# Patient Record
Sex: Male | Born: 1980 | Hispanic: No | Marital: Single | Smoking: Former smoker
Health system: Southern US, Community
[De-identification: ages and names within clinical notes are randomized; demographics above are authoritative.]

## PROBLEM LIST (undated history)

## (undated) DIAGNOSIS — B20 Human immunodeficiency virus [HIV] disease: Secondary | ICD-10-CM

## (undated) HISTORY — DX: Human immunodeficiency virus (HIV) disease: B20

---

## 2014-01-10 ENCOUNTER — Ambulatory Visit: Payer: Self-pay

## 2014-02-03 ENCOUNTER — Telehealth: Payer: Self-pay

## 2014-02-03 NOTE — Telephone Encounter (Signed)
Patient contacted regarding new intake appointment. Date and time given. Information given regarding documents needed to qualify for financial eligibility.  Tammy K King, RN  

## 2014-02-07 ENCOUNTER — Ambulatory Visit: Payer: Self-pay

## 2014-02-08 ENCOUNTER — Ambulatory Visit (INDEPENDENT_AMBULATORY_CARE_PROVIDER_SITE_OTHER): Payer: Self-pay

## 2014-02-08 DIAGNOSIS — Z23 Encounter for immunization: Secondary | ICD-10-CM

## 2014-02-08 DIAGNOSIS — Z113 Encounter for screening for infections with a predominantly sexual mode of transmission: Secondary | ICD-10-CM

## 2014-02-08 DIAGNOSIS — Z21 Asymptomatic human immunodeficiency virus [HIV] infection status: Secondary | ICD-10-CM

## 2014-02-08 DIAGNOSIS — Z79899 Other long term (current) drug therapy: Secondary | ICD-10-CM

## 2014-02-08 DIAGNOSIS — B20 Human immunodeficiency virus [HIV] disease: Secondary | ICD-10-CM

## 2014-02-08 DIAGNOSIS — F329 Major depressive disorder, single episode, unspecified: Secondary | ICD-10-CM | POA: Insufficient documentation

## 2014-02-08 DIAGNOSIS — F32A Depression, unspecified: Secondary | ICD-10-CM

## 2014-02-08 LAB — COMPLETE METABOLIC PANEL WITH GFR
ALK PHOS: 116 U/L (ref 39–117)
ALT: 46 U/L (ref 0–53)
AST: 42 U/L — AB (ref 0–37)
Albumin: 4.2 g/dL (ref 3.5–5.2)
BUN: 10 mg/dL (ref 6–23)
CO2: 23 mEq/L (ref 19–32)
CREATININE: 0.84 mg/dL (ref 0.50–1.35)
Calcium: 8.7 mg/dL (ref 8.4–10.5)
Chloride: 103 mEq/L (ref 96–112)
GFR, Est African American: >89 mL/min
GFR, Est Non African American: >89 mL/min
Glucose, Bld: 108 mg/dL — ABNORMAL HIGH (ref 70–99)
Potassium: 4.2 mEq/L (ref 3.5–5.3)
Sodium: 138 mEq/L (ref 135–145)
Total Bilirubin: 0.2 mg/dL (ref 0.2–1.2)
Total Protein: 8.3 g/dL (ref 6.0–8.3)

## 2014-02-08 LAB — CBC WITH DIFFERENTIAL/PLATELET
BASOS PCT: 0 % (ref 0–1)
Basophils Absolute: 0 10*3/uL (ref 0.0–0.1)
EOS PCT: 6 % — AB (ref 0–5)
Eosinophils Absolute: 0.3 10*3/uL (ref 0.0–0.7)
HEMATOCRIT: 38 % — AB (ref 39.0–52.0)
Hemoglobin: 13.3 g/dL (ref 13.0–17.0)
Lymphocytes Relative: 21 % (ref 12–46)
Lymphs Abs: 1.1 10*3/uL (ref 0.7–4.0)
MCH: 28.4 pg (ref 26.0–34.0)
MCHC: 35 g/dL (ref 30.0–36.0)
MCV: 81.2 fL (ref 78.0–100.0)
MONO ABS: 0.4 10*3/uL (ref 0.1–1.0)
Monocytes Relative: 7 % (ref 3–12)
NEUTROS ABS: 3.3 10*3/uL (ref 1.7–7.7)
Neutrophils Relative %: 66 % (ref 43–77)
PLATELETS: 168 10*3/uL (ref 150–400)
RBC: 4.68 MIL/uL (ref 4.22–5.81)
RDW: 13.1 % (ref 11.5–15.5)
WBC: 5 10*3/uL (ref 4.0–10.5)

## 2014-02-08 LAB — LIPID PANEL
Cholesterol: 197 mg/dL (ref 0–200)
HDL: 39 mg/dL — ABNORMAL LOW (ref >39)
LDL CALC: 130 mg/dL — AB (ref 0–99)
Total CHOL/HDL Ratio: 5.1 Ratio
Triglycerides: 142 mg/dL (ref <150)
VLDL: 28 mg/dL (ref 0–40)

## 2014-02-08 LAB — RPR

## 2014-02-08 NOTE — Progress Notes (Signed)
Patient was referred after having a positive partner testing ( oral swab) [facility].  He is currently asymptomatic. He is having problems with feeling guilt and depression since diagnosis but refused counseling . Recent weight loss due to lack of appetite.  No medical records to request. No tattoos or piercings.  Vaccines updated.

## 2014-02-09 LAB — HIV-1 RNA ULTRAQUANT REFLEX TO GENTYP+
HIV 1 RNA Quant: 73999 copies/mL — ABNORMAL HIGH (ref <20)
HIV-1 RNA QUANT, LOG: 4.87 {Log} — AB (ref <1.30)

## 2014-02-09 LAB — HEPATITIS C ANTIBODY: HCV AB: NEGATIVE

## 2014-02-09 LAB — T-HELPER CELL (CD4) - (RCID CLINIC ONLY)
CD4 T CELL ABS: 180 /uL — AB (ref 400–2700)
CD4 T CELL HELPER: 14 % — AB (ref 33–55)

## 2014-02-09 LAB — URINE CYTOLOGY ANCILLARY ONLY
CHLAMYDIA, DNA PROBE: NEGATIVE
Neisseria Gonorrhea: NEGATIVE

## 2014-02-09 LAB — HEPATITIS A ANTIBODY, TOTAL: HEP A TOTAL AB: REACTIVE — AB

## 2014-02-09 LAB — HEPATITIS B SURFACE ANTIBODY,QUALITATIVE: Hep B S Ab: POSITIVE — AB

## 2014-02-09 LAB — URINALYSIS
Bilirubin Urine: NEGATIVE
Glucose, UA: NEGATIVE mg/dL
Hgb urine dipstick: NEGATIVE
KETONES UR: NEGATIVE mg/dL
Leukocytes, UA: NEGATIVE
Nitrite: NEGATIVE
PROTEIN: NEGATIVE mg/dL
Specific Gravity, Urine: 1.025 (ref 1.005–1.030)
UROBILINOGEN UA: 0.2 mg/dL (ref 0.0–1.0)
pH: 5.5 (ref 5.0–8.0)

## 2014-02-09 LAB — HEPATITIS B CORE ANTIBODY, TOTAL: HEP B C TOTAL AB: NONREACTIVE

## 2014-02-09 LAB — HEPATITIS B SURFACE ANTIGEN: HEP B S AG: NEGATIVE

## 2014-02-11 LAB — QUANTIFERON TB GOLD ASSAY (BLOOD)
Interferon Gamma Release Assay: NEGATIVE
Mitogen value: 0.66 IU/mL
QUANTIFERON TB AG MINUS NIL: 0 [IU]/mL
Quantiferon Nil Value: 0.02 IU/mL
TB AG VALUE: 0.02 [IU]/mL

## 2014-02-13 LAB — HLA B*5701: HLA-B*5701 w/rflx HLA-B High: NEGATIVE

## 2014-02-21 LAB — HIV-1 GENOTYPR PLUS

## 2014-02-23 ENCOUNTER — Encounter: Payer: Self-pay | Admitting: Internal Medicine

## 2014-02-23 ENCOUNTER — Ambulatory Visit (INDEPENDENT_AMBULATORY_CARE_PROVIDER_SITE_OTHER): Payer: Self-pay | Admitting: Internal Medicine

## 2014-02-23 ENCOUNTER — Other Ambulatory Visit: Payer: Self-pay | Admitting: *Deleted

## 2014-02-23 VITALS — BP 110/71 | HR 60 | Temp 98.3°F | Ht 72.0 in | Wt 200.0 lb

## 2014-02-23 DIAGNOSIS — B20 Human immunodeficiency virus [HIV] disease: Secondary | ICD-10-CM

## 2014-02-23 MED ORDER — ELVITEG-COBIC-EMTRICIT-TENOFDF 150-150-200-300 MG PO TABS: 1.0000 | ORAL_TABLET | Freq: Every day | ORAL | Status: DC

## 2014-02-23 MED ORDER — SULFAMETHOXAZOLE-TMP DS 800-160 MG PO TABS: 1.0000 | ORAL_TABLET | Freq: Every day | ORAL | Status: DC

## 2014-02-23 NOTE — Assessment & Plan Note (Signed)
I discussed all the options of treatment, discussed side effects.  Will start Stribild when approved through ADAP.  Also to get Bactrim for OI prophylaxis and given prescription so he can start now.  RTC 4 weeks to see if he is tolerating.  All questions answered.

## 2014-02-23 NOTE — Progress Notes (Signed)
   Subjective:    Patient ID: Eduardo Savage, male    DOB: May 22, 1980, 33 y.o.   MRN: [id number]  HPI Here for a new patient visit.  Received partner testing and was positive.  CD4 of 180 and vl 73,999.  No questions.  Not previously tested.  No weight loss, no diarrhea.  Feels well overall. Accepting of diagnosis.  Feels that he is ready for treatment and no problem taking medicines daily.     Review of Systems  Constitutional: Negative for fever and chills.  HENT: Negative for sore throat and trouble swallowing.   Respiratory: Negative for cough and shortness of breath.   Gastrointestinal: Negative for nausea and diarrhea.  Genitourinary: Negative for discharge.  Skin: Negative for rash.  Neurological: Negative for dizziness and light-headedness.  Hematological: Negative for adenopathy.  Psychiatric/Behavioral: Negative for dysphoric mood.       Objective:   Physical Exam  Constitutional: He appears well-developed and well-nourished. No distress.  HENT:  Mouth/Throat: No oropharyngeal exudate.  Eyes: No scleral icterus.  Cardiovascular: Normal rate, regular rhythm and normal heart sounds.   No murmur heard. Pulmonary/Chest: Effort normal and breath sounds normal. No respiratory distress. He has no wheezes.  Abdominal: Soft. Bowel sounds are normal. He exhibits no distension. There is no tenderness.  Musculoskeletal: He exhibits no edema.  Lymphadenopathy:    He has no cervical adenopathy.  Skin: No rash noted.          Assessment & Plan:

## 2014-03-27 ENCOUNTER — Other Ambulatory Visit: Payer: Self-pay | Admitting: *Deleted

## 2014-03-27 DIAGNOSIS — B20 Human immunodeficiency virus [HIV] disease: Secondary | ICD-10-CM

## 2014-03-27 MED ORDER — ELVITEG-COBIC-EMTRICIT-TENOFDF 150-150-200-300 MG PO TABS: 1.0000 | ORAL_TABLET | Freq: Every day | ORAL | Status: DC

## 2014-03-27 MED ORDER — SULFAMETHOXAZOLE-TRIMETHOPRIM 800-160 MG PO TABS: 1.0000 | ORAL_TABLET | Freq: Two times a day (BID) | ORAL | Status: DC

## 2014-04-05 ENCOUNTER — Encounter: Payer: Self-pay | Admitting: Internal Medicine

## 2014-04-05 ENCOUNTER — Ambulatory Visit (INDEPENDENT_AMBULATORY_CARE_PROVIDER_SITE_OTHER): Payer: Self-pay | Admitting: Internal Medicine

## 2014-04-05 VITALS — BP 122/76 | HR 66 | Temp 98.1°F | Ht 72.0 in | Wt 201.0 lb

## 2014-04-05 DIAGNOSIS — B20 Human immunodeficiency virus [HIV] disease: Secondary | ICD-10-CM

## 2014-04-05 NOTE — Assessment & Plan Note (Signed)
Doing well, now on meds.  Emphasized compliance.  No issues identified.  Labs in 3 week and follow up in 4-5 weeks.

## 2014-04-05 NOTE — Progress Notes (Signed)
   Subjective:    Patient ID: Eduardo Savage, male    DOB: [birth year], 33 y.o.   MRN: [id number]  HPI Here for his second visit for HIV.  Received partner testing and was positive.  CD4 of 180 and vl 73,999.  Not previously tested.  No weight loss, no diarrhea.  Feels well overall. Applied for ADAP and started medicine 1 week ago.  No issues, no n/v, pleased with regimen.     Review of Systems  Constitutional: Negative for fever and chills.  HENT: Negative for sore throat and trouble swallowing.   Respiratory: Negative for cough and shortness of breath.   Gastrointestinal: Negative for nausea and diarrhea.  Genitourinary: Negative for discharge.  Skin: Negative for rash.  Neurological: Negative for dizziness and light-headedness.  Hematological: Negative for adenopathy.  Psychiatric/Behavioral: Negative for dysphoric mood.       Objective:   Physical Exam  Constitutional: He appears well-developed and well-nourished. No distress.  Eyes: No scleral icterus.  Cardiovascular: Normal rate, regular rhythm and normal heart sounds.   No murmur heard. Pulmonary/Chest: Effort normal and breath sounds normal. No respiratory distress.  Lymphadenopathy:    He has no cervical adenopathy.  Skin: No rash noted.          Assessment & Plan:

## 2014-04-26 ENCOUNTER — Other Ambulatory Visit (INDEPENDENT_AMBULATORY_CARE_PROVIDER_SITE_OTHER): Payer: Self-pay

## 2014-04-26 DIAGNOSIS — B20 Human immunodeficiency virus [HIV] disease: Secondary | ICD-10-CM

## 2014-04-27 LAB — COMPLETE METABOLIC PANEL WITH GFR
ALBUMIN: 4.1 g/dL (ref 3.5–5.2)
ALT: 21 U/L (ref 0–53)
AST: 17 U/L (ref 0–37)
Alkaline Phosphatase: 97 U/L (ref 39–117)
BILIRUBIN TOTAL: 0.3 mg/dL (ref 0.2–1.2)
BUN: 11 mg/dL (ref 6–23)
CO2: 27 mEq/L (ref 19–32)
Calcium: 8.6 mg/dL (ref 8.4–10.5)
Chloride: 101 mEq/L (ref 96–112)
Creat: 0.74 mg/dL (ref 0.50–1.35)
GFR, Est African American: >89 mL/min
GLUCOSE: 97 mg/dL (ref 70–99)
Potassium: 4 mEq/L (ref 3.5–5.3)
Sodium: 135 mEq/L (ref 135–145)
Total Protein: 7.8 g/dL (ref 6.0–8.3)

## 2014-04-27 LAB — CBC WITH DIFFERENTIAL/PLATELET
BASOS ABS: 0 10*3/uL (ref 0.0–0.1)
Basophils Relative: 0 % (ref 0–1)
EOS PCT: 5 % (ref 0–5)
Eosinophils Absolute: 0.2 10*3/uL (ref 0.0–0.7)
HCT: 39.3 % (ref 39.0–52.0)
Hemoglobin: 13.6 g/dL (ref 13.0–17.0)
LYMPHS PCT: 35 % (ref 12–46)
Lymphs Abs: 1.7 10*3/uL (ref 0.7–4.0)
MCH: 28.3 pg (ref 26.0–34.0)
MCHC: 34.6 g/dL (ref 30.0–36.0)
MCV: 81.9 fL (ref 78.0–100.0)
MONOS PCT: 6 % (ref 3–12)
MPV: 9.3 fL — ABNORMAL LOW (ref 9.4–12.4)
Monocytes Absolute: 0.3 10*3/uL (ref 0.1–1.0)
Neutro Abs: 2.6 10*3/uL (ref 1.7–7.7)
Neutrophils Relative %: 54 % (ref 43–77)
PLATELETS: 166 10*3/uL (ref 150–400)
RBC: 4.8 MIL/uL (ref 4.22–5.81)
RDW: 13.5 % (ref 11.5–15.5)
WBC: 4.9 10*3/uL (ref 4.0–10.5)

## 2014-04-27 LAB — T-HELPER CELL (CD4) - (RCID CLINIC ONLY)
CD4 T CELL HELPER: 14 % — AB (ref 33–55)
CD4 T Cell Abs: 240 /uL — ABNORMAL LOW (ref 400–2700)

## 2014-04-29 LAB — HIV-1 RNA QUANT-NO REFLEX-BLD
HIV 1 RNA Quant: 376 copies/mL — ABNORMAL HIGH (ref <20)
HIV-1 RNA Quant, Log: 2.58 {Log} — ABNORMAL HIGH (ref <1.30)

## 2014-05-23 ENCOUNTER — Ambulatory Visit: Payer: Self-pay

## 2014-05-23 ENCOUNTER — Ambulatory Visit (INDEPENDENT_AMBULATORY_CARE_PROVIDER_SITE_OTHER): Payer: Self-pay | Admitting: Internal Medicine

## 2014-05-23 ENCOUNTER — Encounter: Payer: Self-pay | Admitting: Internal Medicine

## 2014-05-23 VITALS — BP 129/80 | HR 80 | Temp 98.1°F | Wt 193.0 lb

## 2014-05-23 DIAGNOSIS — B20 Human immunodeficiency virus [HIV] disease: Secondary | ICD-10-CM

## 2014-05-23 MED ORDER — ELVITEG-COBIC-EMTRICIT-TENOFAF 150-150-200-10 MG PO TABS: 1.0000 | ORAL_TABLET | Freq: Every day | ORAL | Status: DC

## 2014-05-23 NOTE — Assessment & Plan Note (Signed)
[facility] with next refill which is today. I will also get his labs today to assure he comes down to undetectable and remains over 200. He will then return in 3 months with labs before.

## 2014-05-23 NOTE — Progress Notes (Signed)
   Subjective:    Patient ID: Eduardo Savage, male    DOB: 06-07-1980, 34 y.o.   MRN: [id number]  HPI Here for his second visit for HIV.  Received partner testing and was positive.  CD4 of 180 and vl 73,999.  Not previously tested.  No weight loss, no diarrhea.  Feels well overall. Applied for ADAP and started medicine 2 months ago.  No issues, no n/v, pleased with regimen.     Review of Systems  Constitutional: Negative for fever and chills.  HENT: Negative for sore throat and trouble swallowing.   Respiratory: Negative for cough and shortness of breath.   Gastrointestinal: Negative for nausea and diarrhea.  Genitourinary: Negative for discharge.  Skin: Negative for rash.  Neurological: Negative for dizziness and light-headedness.  Hematological: Negative for adenopathy.  Psychiatric/Behavioral: Negative for dysphoric mood.       Objective:   Physical Exam  Constitutional: He appears well-developed and well-nourished. No distress.  Eyes: No scleral icterus.  Cardiovascular: Normal rate, regular rhythm and normal heart sounds.   No murmur heard. Pulmonary/Chest: Effort normal and breath sounds normal. No respiratory distress.  Lymphadenopathy:    He has no cervical adenopathy.  Skin: No rash noted.          Assessment & Plan:

## 2014-05-24 ENCOUNTER — Other Ambulatory Visit: Payer: Self-pay | Admitting: *Deleted

## 2014-05-24 MED ORDER — ELVITEG-COBIC-EMTRICIT-TENOFAF 150-150-200-10 MG PO TABS: 1.0000 | ORAL_TABLET | Freq: Every day | ORAL | Status: DC

## 2014-08-22 ENCOUNTER — Other Ambulatory Visit (INDEPENDENT_AMBULATORY_CARE_PROVIDER_SITE_OTHER): Payer: Self-pay

## 2014-08-22 DIAGNOSIS — B20 Human immunodeficiency virus [HIV] disease: Secondary | ICD-10-CM

## 2014-08-22 LAB — COMPLETE METABOLIC PANEL WITH GFR
ALK PHOS: 101 U/L (ref 39–117)
ALT: 28 U/L (ref 0–53)
AST: 25 U/L (ref 0–37)
Albumin: 4.3 g/dL (ref 3.5–5.2)
BUN: 13 mg/dL (ref 6–23)
CO2: 23 mEq/L (ref 19–32)
Calcium: 8.6 mg/dL (ref 8.4–10.5)
Chloride: 105 mEq/L (ref 96–112)
Creat: 0.96 mg/dL (ref 0.50–1.35)
GFR, Est African American: >89 mL/min
GFR, Est Non African American: >89 mL/min
Glucose, Bld: 91 mg/dL (ref 70–99)
POTASSIUM: 3.9 meq/L (ref 3.5–5.3)
SODIUM: 138 meq/L (ref 135–145)
TOTAL PROTEIN: 7.5 g/dL (ref 6.0–8.3)
Total Bilirubin: 0.4 mg/dL (ref 0.2–1.2)

## 2014-08-23 LAB — CBC WITH DIFFERENTIAL/PLATELET
BASOS ABS: 0.1 10*3/uL (ref 0.0–0.1)
BASOS PCT: 1 % (ref 0–1)
Eosinophils Absolute: 0.5 10*3/uL (ref 0.0–0.7)
Eosinophils Relative: 8 % — ABNORMAL HIGH (ref 0–5)
HCT: 39.8 % (ref 39.0–52.0)
Hemoglobin: 13.4 g/dL (ref 13.0–17.0)
LYMPHS PCT: 26 % (ref 12–46)
Lymphs Abs: 1.6 10*3/uL (ref 0.7–4.0)
MCH: 28.7 pg (ref 26.0–34.0)
MCHC: 33.7 g/dL (ref 30.0–36.0)
MCV: 85.2 fL (ref 78.0–100.0)
MPV: 9.4 fL (ref 8.6–12.4)
Monocytes Absolute: 0.4 10*3/uL (ref 0.1–1.0)
Monocytes Relative: 6 % (ref 3–12)
NEUTROS ABS: 3.5 10*3/uL (ref 1.7–7.7)
NEUTROS PCT: 59 % (ref 43–77)
Platelets: 213 10*3/uL (ref 150–400)
RBC: 4.67 MIL/uL (ref 4.22–5.81)
RDW: 12.9 % (ref 11.5–15.5)
WBC: 6 10*3/uL (ref 4.0–10.5)

## 2014-08-23 LAB — T-HELPER CELL (CD4) - (RCID CLINIC ONLY)
CD4 T CELL HELPER: 17 % — AB (ref 33–55)
CD4 T Cell Abs: 290 /uL — ABNORMAL LOW (ref 400–2700)

## 2014-08-24 LAB — HIV-1 RNA QUANT-NO REFLEX-BLD
HIV 1 RNA Quant: <20 copies/mL (ref <20)
HIV-1 RNA Quant, Log: <1.3 {Log} (ref <1.30)

## 2014-09-05 ENCOUNTER — Ambulatory Visit (INDEPENDENT_AMBULATORY_CARE_PROVIDER_SITE_OTHER): Payer: Self-pay | Admitting: Internal Medicine

## 2014-09-05 ENCOUNTER — Encounter: Payer: Self-pay | Admitting: Internal Medicine

## 2014-09-05 VITALS — BP 116/76 | HR 58 | Temp 97.6°F | Ht 72.0 in | Wt 215.0 lb

## 2014-09-05 DIAGNOSIS — F32A Depression, unspecified: Secondary | ICD-10-CM

## 2014-09-05 DIAGNOSIS — B20 Human immunodeficiency virus [HIV] disease: Secondary | ICD-10-CM

## 2014-09-05 DIAGNOSIS — F329 Major depressive disorder, single episode, unspecified: Secondary | ICD-10-CM

## 2014-09-05 NOTE — Progress Notes (Signed)
   Subjective:    Patient ID: Eduardo Savage, male    DOB: 1980/05/28, 34 y.o.   MRN: [id number]  HPI Here for his second visit for HIV.  Received partner testing and was positive.  CD4 of 180 and vl 73,999 October 2015.  Not previously tested.  No weight loss, no diarrhea.    No issues, no n/v, pleased with Genvoya. No missed doses.  Now CD4 up to 290 and viral load undetectable.  Some depression but no SI.  Hoping for a cure.  Has family support who know his diagnosis and he can talk to. Sexually active with male partner.     Review of Systems  Constitutional: Negative for fever and chills.  HENT: Negative for sore throat and trouble swallowing.   Respiratory: Negative for cough and shortness of breath.   Gastrointestinal: Negative for nausea and diarrhea.  Genitourinary: Negative for discharge and genital sores.  Skin: Negative for rash.  Neurological: Negative for dizziness and light-headedness.  Hematological: Negative for adenopathy.  Psychiatric/Behavioral: Negative for dysphoric mood.       Objective:   Physical Exam  Constitutional: He appears well-developed and well-nourished. No distress.  Eyes: No scleral icterus.  Cardiovascular: Normal rate, regular rhythm and normal heart sounds.   No murmur heard. Pulmonary/Chest: Effort normal and breath sounds normal. No respiratory distress.  Lymphadenopathy:    He has no cervical adenopathy.  Skin: No rash noted.          Assessment & Plan:

## 2014-09-05 NOTE — Assessment & Plan Note (Signed)
States he has good support and not interested in counseling.

## 2014-09-05 NOTE — Assessment & Plan Note (Signed)
Doing great.  RTC in 3 months.

## 2014-12-05 ENCOUNTER — Other Ambulatory Visit: Payer: Self-pay | Admitting: *Deleted

## 2014-12-05 DIAGNOSIS — B20 Human immunodeficiency virus [HIV] disease: Secondary | ICD-10-CM

## 2014-12-05 MED ORDER — ELVITEG-COBIC-EMTRICIT-TENOFAF 150-150-200-10 MG PO TABS: 1.0000 | ORAL_TABLET | Freq: Every day | ORAL | Status: DC

## 2014-12-06 ENCOUNTER — Other Ambulatory Visit: Payer: Self-pay | Admitting: *Deleted

## 2014-12-06 DIAGNOSIS — B20 Human immunodeficiency virus [HIV] disease: Secondary | ICD-10-CM

## 2014-12-06 MED ORDER — ELVITEG-COBIC-EMTRICIT-TENOFAF 150-150-200-10 MG PO TABS: 1.0000 | ORAL_TABLET | Freq: Every day | ORAL | Status: DC

## 2014-12-18 ENCOUNTER — Other Ambulatory Visit: Payer: Self-pay

## 2015-01-01 ENCOUNTER — Ambulatory Visit: Payer: Self-pay | Admitting: Internal Medicine

## 2015-01-16 ENCOUNTER — Other Ambulatory Visit: Payer: Self-pay

## 2015-01-30 ENCOUNTER — Ambulatory Visit: Payer: Self-pay | Admitting: Internal Medicine

## 2015-02-13 ENCOUNTER — Ambulatory Visit: Payer: Self-pay

## 2015-03-06 ENCOUNTER — Other Ambulatory Visit: Payer: Self-pay | Admitting: *Deleted

## 2015-03-06 DIAGNOSIS — B20 Human immunodeficiency virus [HIV] disease: Secondary | ICD-10-CM

## 2015-03-06 MED ORDER — ELVITEG-COBIC-EMTRICIT-TENOFAF 150-150-200-10 MG PO TABS: 1.0000 | ORAL_TABLET | Freq: Every day | ORAL | Status: DC

## 2015-03-27 NOTE — Progress Notes (Signed)
Notified [facility] via fax. Howell, Michelle M, RN 

## 2015-04-24 ENCOUNTER — Ambulatory Visit (INDEPENDENT_AMBULATORY_CARE_PROVIDER_SITE_OTHER): Payer: Self-pay | Admitting: Internal Medicine

## 2015-04-24 ENCOUNTER — Encounter: Payer: Self-pay | Admitting: Internal Medicine

## 2015-04-24 VITALS — BP 120/71 | HR 56 | Temp 97.7°F | Wt 216.0 lb

## 2015-04-24 DIAGNOSIS — F32A Depression, unspecified: Secondary | ICD-10-CM

## 2015-04-24 DIAGNOSIS — F329 Major depressive disorder, single episode, unspecified: Secondary | ICD-10-CM

## 2015-04-24 DIAGNOSIS — Z79899 Other long term (current) drug therapy: Secondary | ICD-10-CM | POA: Insufficient documentation

## 2015-04-24 DIAGNOSIS — B20 Human immunodeficiency virus [HIV] disease: Secondary | ICD-10-CM

## 2015-04-24 DIAGNOSIS — Z23 Encounter for immunization: Secondary | ICD-10-CM

## 2015-04-24 DIAGNOSIS — Z113 Encounter for screening for infections with a predominantly sexual mode of transmission: Secondary | ICD-10-CM

## 2015-04-24 LAB — LIPID PANEL
Cholesterol: 196 mg/dL (ref 125–200)
HDL: 33 mg/dL — AB (ref >=40)
LDL Cholesterol: 123 mg/dL (ref <130)
Total CHOL/HDL Ratio: 5.9 Ratio — ABNORMAL HIGH (ref <=5.0)
Triglycerides: 202 mg/dL — ABNORMAL HIGH (ref <150)
VLDL: 40 mg/dL — ABNORMAL HIGH (ref <30)

## 2015-04-24 LAB — COMPLETE METABOLIC PANEL WITH GFR
ALT: 22 U/L (ref 9–46)
AST: 17 U/L (ref 10–40)
Albumin: 4.2 g/dL (ref 3.6–5.1)
Alkaline Phosphatase: 104 U/L (ref 40–115)
BUN: 12 mg/dL (ref 7–25)
CALCIUM: 8.9 mg/dL (ref 8.6–10.3)
CHLORIDE: 103 mmol/L (ref 98–110)
CO2: 23 mmol/L (ref 20–31)
CREATININE: 0.84 mg/dL (ref 0.60–1.35)
GFR, Est African American: >89 mL/min (ref >=60)
Glucose, Bld: 89 mg/dL (ref 65–99)
POTASSIUM: 3.8 mmol/L (ref 3.5–5.3)
Sodium: 135 mmol/L (ref 135–146)
Total Bilirubin: 0.3 mg/dL (ref 0.2–1.2)
Total Protein: 7.3 g/dL (ref 6.1–8.1)

## 2015-04-24 LAB — CBC WITH DIFFERENTIAL/PLATELET
BASOS ABS: 0.1 10*3/uL (ref 0.0–0.1)
Basophils Relative: 1 % (ref 0–1)
EOS ABS: 0.3 10*3/uL (ref 0.0–0.7)
EOS PCT: 5 % (ref 0–5)
HEMATOCRIT: 39.9 % (ref 39.0–52.0)
Hemoglobin: 13.7 g/dL (ref 13.0–17.0)
LYMPHS PCT: 32 % (ref 12–46)
Lymphs Abs: 1.8 10*3/uL (ref 0.7–4.0)
MCH: 28.2 pg (ref 26.0–34.0)
MCHC: 34.3 g/dL (ref 30.0–36.0)
MCV: 82.3 fL (ref 78.0–100.0)
MPV: 9.3 fL (ref 8.6–12.4)
Monocytes Absolute: 0.5 10*3/uL (ref 0.1–1.0)
Monocytes Relative: 9 % (ref 3–12)
Neutro Abs: 3 10*3/uL (ref 1.7–7.7)
Neutrophils Relative %: 53 % (ref 43–77)
PLATELETS: 222 10*3/uL (ref 150–400)
RBC: 4.85 MIL/uL (ref 4.22–5.81)
RDW: 13.3 % (ref 11.5–15.5)
WBC: 5.6 10*3/uL (ref 4.0–10.5)

## 2015-04-24 NOTE — Assessment & Plan Note (Signed)
Hopefully doing well.  Labs today and rtc 4 months unless concerns.

## 2015-04-24 NOTE — Progress Notes (Signed)
CC: Follow up for HIV  Interval history: Currently is asymptomatic and well-controlled on Genvoya.  Since last visit he was on Genvoya but unfortunately did not renew ADAP in time and stopped for over 1 month.  He restarted again in October and taking daily.  He feels he is getting used to it.  Denies missed doses when he has it.   Less depression  Prior to Admission medications   Medication Sig Start Date End Date Taking? Authorizing Provider  elvitegravir-cobicistat-emtricitabine-tenofovir (GENVOYA) 150-150-200-10 MG TABS tablet Take 1 tablet by mouth daily with breakfast. 03/06/15  Yes Cliffton AstersJohn Campbell, MD    Review of Systems Constitutional: negative for fatigue and malaise Gastrointestinal: negative for nausea and diarrhea All other systems reviewed and are negative   Physical Exam: CONSTITUTIONAL:in no apparent distress and alert  Filed Vitals:   04/24/15 1009  BP: 120/71  Pulse: 56  Temp: 97.7 F (36.5 C)   Eyes: anicteric HENT: no thrush, no cervical lymphadenopathy Respiratory: Normal respiratory effort; CTA B  Lab Results  Component Value Date   HIV1RNAQUANT <20 08/22/2014   HIV1RNAQUANT 376* 04/26/2014   HIV1RNAQUANT [id number]* 02/08/2014   No components found for: HIV1GENOTYPRPLUS No components found for: THELPERCELL

## 2015-04-24 NOTE — Assessment & Plan Note (Signed)
Improved as he is more accepting.

## 2015-04-24 NOTE — Addendum Note (Signed)
Addended by: Mariea ClontsGREEN, Clarabelle Oscarson D on: 04/24/2015 10:40 AM   Modules accepted: Orders

## 2015-04-25 LAB — T-HELPER CELL (CD4) - (RCID CLINIC ONLY)
CD4 % Helper T Cell: 17 % — ABNORMAL LOW (ref 33–55)
CD4 T CELL ABS: 310 /uL — AB (ref 400–2700)

## 2015-04-25 LAB — RPR

## 2015-04-26 LAB — HIV-1 RNA QUANT-NO REFLEX-BLD
HIV 1 RNA Quant: <20 copies/mL (ref <20)
HIV-1 RNA Quant, Log: <1.3 Log copies/mL (ref <1.30)

## 2015-05-21 ENCOUNTER — Ambulatory Visit: Payer: Self-pay

## 2015-10-20 ENCOUNTER — Other Ambulatory Visit: Payer: Self-pay | Admitting: Internal Medicine

## 2015-10-20 DIAGNOSIS — B20 Human immunodeficiency virus [HIV] disease: Secondary | ICD-10-CM

## 2015-11-27 ENCOUNTER — Ambulatory Visit: Payer: Self-pay

## 2015-12-05 ENCOUNTER — Encounter: Payer: Self-pay | Admitting: Internal Medicine

## 2015-12-05 ENCOUNTER — Ambulatory Visit: Payer: Self-pay

## 2015-12-29 ENCOUNTER — Other Ambulatory Visit: Payer: Self-pay | Admitting: Internal Medicine

## 2015-12-29 DIAGNOSIS — B20 Human immunodeficiency virus [HIV] disease: Secondary | ICD-10-CM

## 2016-03-08 ENCOUNTER — Other Ambulatory Visit: Payer: Self-pay | Admitting: Internal Medicine

## 2016-03-08 DIAGNOSIS — B20 Human immunodeficiency virus [HIV] disease: Secondary | ICD-10-CM

## 2016-03-10 ENCOUNTER — Telehealth: Payer: Self-pay | Admitting: *Deleted

## 2016-03-10 NOTE — Telephone Encounter (Signed)
Pharmacy requesting refill, patient due for visit. RN contacted patient, gave appointments, 1 refill of medication. Patient understands he must complete his appointments for future refills. Andree CossHowell, Nahal Wanless M, RN

## 2016-03-17 ENCOUNTER — Other Ambulatory Visit: Payer: Self-pay

## 2016-03-20 ENCOUNTER — Other Ambulatory Visit (INDEPENDENT_AMBULATORY_CARE_PROVIDER_SITE_OTHER): Payer: Self-pay

## 2016-03-20 DIAGNOSIS — Z113 Encounter for screening for infections with a predominantly sexual mode of transmission: Secondary | ICD-10-CM

## 2016-03-20 DIAGNOSIS — B20 Human immunodeficiency virus [HIV] disease: Secondary | ICD-10-CM

## 2016-03-20 DIAGNOSIS — Z79899 Other long term (current) drug therapy: Secondary | ICD-10-CM

## 2016-03-20 LAB — COMPREHENSIVE METABOLIC PANEL
ALBUMIN: 4.3 g/dL (ref 3.6–5.1)
ALT: 21 U/L (ref 9–46)
AST: 15 U/L (ref 10–40)
Alkaline Phosphatase: 118 U/L — ABNORMAL HIGH (ref 40–115)
BUN: 11 mg/dL (ref 7–25)
CALCIUM: 8.8 mg/dL (ref 8.6–10.3)
CHLORIDE: 103 mmol/L (ref 98–110)
CO2: 23 mmol/L (ref 20–31)
Creat: 0.98 mg/dL (ref 0.60–1.35)
GLUCOSE: 135 mg/dL — AB (ref 65–99)
Potassium: 3.8 mmol/L (ref 3.5–5.3)
SODIUM: 136 mmol/L (ref 135–146)
Total Bilirubin: 0.3 mg/dL (ref 0.2–1.2)
Total Protein: 7.4 g/dL (ref 6.1–8.1)

## 2016-03-20 LAB — LIPID PANEL
CHOL/HDL RATIO: 5 ratio — AB (ref <5.0)
CHOLESTEROL: 205 mg/dL — AB (ref <200)
HDL: 41 mg/dL (ref >40)
LDL Cholesterol: 131 mg/dL — ABNORMAL HIGH (ref <100)
Triglycerides: 167 mg/dL — ABNORMAL HIGH (ref <150)
VLDL: 33 mg/dL — ABNORMAL HIGH (ref <30)

## 2016-03-20 LAB — CBC
HEMATOCRIT: 41.2 % (ref 38.5–50.0)
HEMOGLOBIN: 14.1 g/dL (ref 13.2–17.1)
MCH: 29 pg (ref 27.0–33.0)
MCHC: 34.2 g/dL (ref 32.0–36.0)
MCV: 84.8 fL (ref 80.0–100.0)
MPV: 9.6 fL (ref 7.5–12.5)
Platelets: 246 10*3/uL (ref 140–400)
RBC: 4.86 MIL/uL (ref 4.20–5.80)
RDW: 12.9 % (ref 11.0–15.0)
WBC: 7.8 10*3/uL (ref 3.8–10.8)

## 2016-03-21 LAB — T-HELPER CELL (CD4) - (RCID CLINIC ONLY)
CD4 T CELL HELPER: 21 % — AB (ref 33–55)
CD4 T Cell Abs: 530 /uL (ref 400–2700)

## 2016-03-21 LAB — RPR

## 2016-03-24 LAB — HIV-1 RNA QUANT-NO REFLEX-BLD
HIV 1 RNA Quant: 33 copies/mL — ABNORMAL HIGH (ref <20)
HIV-1 RNA QUANT, LOG: 1.52 {Log_copies}/mL — AB (ref <1.30)

## 2016-03-31 ENCOUNTER — Ambulatory Visit: Payer: Self-pay | Admitting: Internal Medicine

## 2016-04-19 ENCOUNTER — Other Ambulatory Visit: Payer: Self-pay | Admitting: Internal Medicine

## 2016-04-19 DIAGNOSIS — B20 Human immunodeficiency virus [HIV] disease: Secondary | ICD-10-CM

## 2016-09-02 ENCOUNTER — Ambulatory Visit: Payer: Self-pay

## 2016-09-04 ENCOUNTER — Other Ambulatory Visit: Payer: Self-pay | Admitting: *Deleted

## 2016-09-04 DIAGNOSIS — B20 Human immunodeficiency virus [HIV] disease: Secondary | ICD-10-CM

## 2016-09-04 MED ORDER — ELVITEG-COBIC-EMTRICIT-TENOFAF 150-150-200-10 MG PO TABS: 1.0000 | ORAL_TABLET | Freq: Every day | ORAL | 2 refills | Status: DC

## 2016-09-05 ENCOUNTER — Encounter: Payer: Self-pay | Admitting: Internal Medicine

## 2016-09-10 ENCOUNTER — Other Ambulatory Visit: Payer: Self-pay

## 2016-09-10 ENCOUNTER — Other Ambulatory Visit (INDEPENDENT_AMBULATORY_CARE_PROVIDER_SITE_OTHER): Payer: Self-pay

## 2016-09-10 DIAGNOSIS — B20 Human immunodeficiency virus [HIV] disease: Secondary | ICD-10-CM

## 2016-09-10 DIAGNOSIS — Z113 Encounter for screening for infections with a predominantly sexual mode of transmission: Secondary | ICD-10-CM

## 2016-09-10 LAB — CBC WITH DIFFERENTIAL/PLATELET
Basophils Absolute: 80 cells/uL (ref 0–200)
Basophils Relative: 1 %
EOS ABS: 480 {cells}/uL (ref 15–500)
Eosinophils Relative: 6 %
HEMATOCRIT: 42.5 % (ref 38.5–50.0)
HEMOGLOBIN: 13.8 g/dL (ref 13.2–17.1)
LYMPHS ABS: 2480 {cells}/uL (ref 850–3900)
Lymphocytes Relative: 31 %
MCH: 27.4 pg (ref 27.0–33.0)
MCHC: 32.5 g/dL (ref 32.0–36.0)
MCV: 84.5 fL (ref 80.0–100.0)
MPV: 9.3 fL (ref 7.5–12.5)
Monocytes Absolute: 560 cells/uL (ref 200–950)
Monocytes Relative: 7 %
Neutro Abs: 4400 cells/uL (ref 1500–7800)
Neutrophils Relative %: 55 %
Platelets: 222 10*3/uL (ref 140–400)
RBC: 5.03 MIL/uL (ref 4.20–5.80)
RDW: 13.8 % (ref 11.0–15.0)
WBC: 8 10*3/uL (ref 3.8–10.8)

## 2016-09-10 LAB — COMPLETE METABOLIC PANEL WITH GFR
ALBUMIN: 4.1 g/dL (ref 3.6–5.1)
ALT: 26 U/L (ref 9–46)
AST: 20 U/L (ref 10–40)
Alkaline Phosphatase: 108 U/L (ref 40–115)
BILIRUBIN TOTAL: 0.3 mg/dL (ref 0.2–1.2)
BUN: 12 mg/dL (ref 7–25)
CALCIUM: 8.4 mg/dL — AB (ref 8.6–10.3)
CO2: 24 mmol/L (ref 20–31)
CREATININE: 0.89 mg/dL (ref 0.60–1.35)
Chloride: 106 mmol/L (ref 98–110)
GFR, Est Non African American: >89 mL/min (ref >=60)
Glucose, Bld: 102 mg/dL — ABNORMAL HIGH (ref 65–99)
Potassium: 3.6 mmol/L (ref 3.5–5.3)
Sodium: 140 mmol/L (ref 135–146)
Total Protein: 6.9 g/dL (ref 6.1–8.1)

## 2016-09-10 MED ORDER — ELVITEG-COBIC-EMTRICIT-TENOFAF 150-150-200-10 MG PO TABS: 1.0000 | ORAL_TABLET | Freq: Every day | ORAL | 2 refills | Status: DC

## 2016-09-11 LAB — URINE CYTOLOGY ANCILLARY ONLY
Chlamydia: NEGATIVE
Neisseria Gonorrhea: NEGATIVE

## 2016-09-11 LAB — RPR

## 2016-09-11 LAB — T-HELPER CELL (CD4) - (RCID CLINIC ONLY)
CD4 T CELL ABS: 560 /uL (ref 400–2700)
CD4 T CELL HELPER: 23 % — AB (ref 33–55)

## 2016-09-13 LAB — HIV-1 RNA,QN PCR W/REFLEX GENOTYPE: HIV-1 RNA, QN PCR: <20 Copies/mL — ABNORMAL HIGH

## 2016-09-23 ENCOUNTER — Encounter: Payer: Self-pay | Admitting: Internal Medicine

## 2016-09-23 ENCOUNTER — Ambulatory Visit (INDEPENDENT_AMBULATORY_CARE_PROVIDER_SITE_OTHER): Payer: Self-pay | Admitting: Internal Medicine

## 2016-09-23 VITALS — BP 121/80 | HR 65 | Temp 97.9°F | Ht 72.0 in | Wt 216.0 lb

## 2016-09-23 DIAGNOSIS — B20 Human immunodeficiency virus [HIV] disease: Secondary | ICD-10-CM

## 2016-09-23 DIAGNOSIS — Z23 Encounter for immunization: Secondary | ICD-10-CM

## 2016-09-23 DIAGNOSIS — Z113 Encounter for screening for infections with a predominantly sexual mode of transmission: Secondary | ICD-10-CM

## 2016-09-23 NOTE — Addendum Note (Signed)
Addended by: Linnell FullingBRANNON, Tayte Childers N on: 09/23/2016 03:28 PM   Modules accepted: Orders

## 2016-09-23 NOTE — Progress Notes (Signed)
   Subjective:    Patient ID: Eduardo Savage, male    DOB: 1980/11/12, 36 y.o.   MRN: [id number]  HPI Here for follow up of HIV Has not been seen by me in about 2 years.  Has though had labs and CD4 has remained good and is 560 with a suppressed viral load.  No new issues.  Takes daily with no missed doses.  No sexual activity.     Review of Systems  Constitutional: Negative for unexpected weight change.  Gastrointestinal: Negative for diarrhea.  Skin: Negative for rash.  Neurological: Negative for dizziness.       Objective:   Physical Exam  Constitutional: He appears well-developed and well-nourished. No distress.  HENT:  Mouth/Throat: No oropharyngeal exudate.  Eyes: No scleral icterus.  Cardiovascular: Normal rate, regular rhythm and normal heart sounds.   No murmur heard. Pulmonary/Chest: Effort normal and breath sounds normal. No respiratory distress.  Lymphadenopathy:    He has no cervical adenopathy.  Skin: No rash noted.          Assessment & Plan:

## 2016-09-23 NOTE — Assessment & Plan Note (Signed)
Screened negative 

## 2016-09-23 NOTE — Assessment & Plan Note (Signed)
Doing well at this point and will continue with every 6 month follow up

## 2016-12-29 ENCOUNTER — Encounter: Payer: Self-pay | Admitting: Internal Medicine

## 2017-03-31 ENCOUNTER — Encounter: Payer: Self-pay | Admitting: Internal Medicine

## 2017-03-31 ENCOUNTER — Ambulatory Visit (INDEPENDENT_AMBULATORY_CARE_PROVIDER_SITE_OTHER): Payer: Self-pay | Admitting: Internal Medicine

## 2017-03-31 VITALS — BP 124/77 | HR 58 | Temp 97.9°F | Wt 219.0 lb

## 2017-03-31 DIAGNOSIS — Z79899 Other long term (current) drug therapy: Secondary | ICD-10-CM

## 2017-03-31 DIAGNOSIS — Z23 Encounter for immunization: Secondary | ICD-10-CM

## 2017-03-31 DIAGNOSIS — B20 Human immunodeficiency virus [HIV] disease: Secondary | ICD-10-CM

## 2017-03-31 LAB — LIPID PANEL
CHOL/HDL RATIO: 6.2 (calc) — AB (ref <5.0)
Cholesterol: 223 mg/dL — ABNORMAL HIGH (ref <200)
HDL: 36 mg/dL — ABNORMAL LOW (ref >40)
LDL Cholesterol (Calc): 153 mg/dL (calc) — ABNORMAL HIGH
NON-HDL CHOLESTEROL (CALC): 187 mg/dL — AB (ref <130)
TRIGLYCERIDES: 198 mg/dL — AB (ref <150)

## 2017-03-31 NOTE — Progress Notes (Signed)
   Subjective:    Patient ID: Eduardo Savage, male    DOB: [birth year], 36 y.o.   MRN: [id number]  HPI Here for follow up of HIV Continues on Genvoya and no issues.  No missed doses.  No associated n/v/d.  No complaints.     Review of Systems  Gastrointestinal: Negative for nausea.  Skin: Negative for rash.       Objective:   Physical Exam  Constitutional: He appears well-developed and well-nourished. No distress.  Eyes: No scleral icterus.  Cardiovascular: Normal rate, regular rhythm and normal heart sounds.  No murmur heard. Pulmonary/Chest: Effort normal and breath sounds normal. No respiratory distress.  Lymphadenopathy:    He has no cervical adenopathy.  Skin: No rash noted.          Assessment & Plan:

## 2017-03-31 NOTE — Assessment & Plan Note (Signed)
Labs today and rtc 6 months unless concerns.  

## 2017-03-31 NOTE — Assessment & Plan Note (Signed)
Lipid panel today

## 2017-04-01 LAB — T-HELPER CELL (CD4) - (RCID CLINIC ONLY)
CD4 T CELL HELPER: 21 % — AB (ref 33–55)
CD4 T Cell Abs: 470 /uL (ref 400–2700)

## 2017-04-02 LAB — HIV-1 RNA QUANT-NO REFLEX-BLD
HIV 1 RNA QUANT: NOT DETECTED {copies}/mL
HIV-1 RNA Quant, Log: <1.3 Log copies/mL

## 2017-04-18 ENCOUNTER — Other Ambulatory Visit: Payer: Self-pay | Admitting: Internal Medicine

## 2017-04-18 DIAGNOSIS — B20 Human immunodeficiency virus [HIV] disease: Secondary | ICD-10-CM

## 2017-06-25 ENCOUNTER — Encounter: Payer: Self-pay | Admitting: Internal Medicine

## 2017-09-29 ENCOUNTER — Ambulatory Visit: Payer: Self-pay | Admitting: Internal Medicine

## 2017-10-01 ENCOUNTER — Other Ambulatory Visit: Payer: Self-pay | Admitting: Internal Medicine

## 2017-10-01 DIAGNOSIS — B20 Human immunodeficiency virus [HIV] disease: Secondary | ICD-10-CM

## 2017-10-05 ENCOUNTER — Other Ambulatory Visit: Payer: Self-pay | Admitting: *Deleted

## 2017-10-05 DIAGNOSIS — Z113 Encounter for screening for infections with a predominantly sexual mode of transmission: Secondary | ICD-10-CM

## 2017-10-05 DIAGNOSIS — B20 Human immunodeficiency virus [HIV] disease: Secondary | ICD-10-CM

## 2017-10-07 ENCOUNTER — Other Ambulatory Visit: Payer: Self-pay

## 2017-10-08 ENCOUNTER — Other Ambulatory Visit: Payer: Self-pay

## 2017-10-13 ENCOUNTER — Other Ambulatory Visit: Payer: Self-pay

## 2017-10-13 ENCOUNTER — Emergency Department (HOSPITAL_COMMUNITY)
Admission: EM | Admit: 2017-10-13 | Discharge: 2017-10-14 | Disposition: A | Discharge status: Home / Self Care | Payer: Self-pay | Attending: Emergency Medicine | Admitting: Emergency Medicine

## 2017-10-13 DIAGNOSIS — R05 Cough: Secondary | ICD-10-CM | POA: Insufficient documentation

## 2017-10-13 DIAGNOSIS — R059 Cough, unspecified: Secondary | ICD-10-CM

## 2017-10-13 DIAGNOSIS — Z79899 Other long term (current) drug therapy: Secondary | ICD-10-CM | POA: Insufficient documentation

## 2017-10-13 DIAGNOSIS — Z87891 Personal history of nicotine dependence: Secondary | ICD-10-CM | POA: Insufficient documentation

## 2017-10-14 ENCOUNTER — Emergency Department (HOSPITAL_COMMUNITY): Payer: Self-pay

## 2017-10-14 ENCOUNTER — Encounter (HOSPITAL_COMMUNITY): Payer: Self-pay | Admitting: Emergency Medicine

## 2017-10-14 LAB — COMPREHENSIVE METABOLIC PANEL
ALBUMIN: 3.7 g/dL (ref 3.5–5.0)
ALK PHOS: 98 U/L (ref 38–126)
ALT: 22 U/L (ref 17–63)
ANION GAP: 8 (ref 5–15)
AST: 21 U/L (ref 15–41)
BUN: 11 mg/dL (ref 6–20)
CO2: 24 mmol/L (ref 22–32)
Calcium: 8.4 mg/dL — ABNORMAL LOW (ref 8.9–10.3)
Chloride: 107 mmol/L (ref 101–111)
Creatinine, Ser: 0.82 mg/dL (ref 0.61–1.24)
GFR calc Af Amer: >60 mL/min (ref >60)
GFR calc non Af Amer: >60 mL/min (ref >60)
GLUCOSE: 137 mg/dL — AB (ref 65–99)
POTASSIUM: 3.2 mmol/L — AB (ref 3.5–5.1)
SODIUM: 139 mmol/L (ref 135–145)
Total Bilirubin: 0.4 mg/dL (ref 0.3–1.2)
Total Protein: 7 g/dL (ref 6.5–8.1)

## 2017-10-14 LAB — CBC WITH DIFFERENTIAL/PLATELET
ABS IMMATURE GRANULOCYTES: 0.1 10*3/uL (ref 0.0–0.1)
Basophils Absolute: 0.1 10*3/uL (ref 0.0–0.1)
Basophils Relative: 1 %
Eosinophils Absolute: 0.4 10*3/uL (ref 0.0–0.7)
Eosinophils Relative: 3 %
HEMATOCRIT: 43.3 % (ref 39.0–52.0)
HEMOGLOBIN: 14.3 g/dL (ref 13.0–17.0)
Immature Granulocytes: 1 %
LYMPHS PCT: 27 %
Lymphs Abs: 3.3 10*3/uL (ref 0.7–4.0)
MCH: 27.2 pg (ref 26.0–34.0)
MCHC: 33 g/dL (ref 30.0–36.0)
MCV: 82.5 fL (ref 78.0–100.0)
MONO ABS: 0.7 10*3/uL (ref 0.1–1.0)
MONOS PCT: 6 %
NEUTROS ABS: 7.9 10*3/uL — AB (ref 1.7–7.7)
Neutrophils Relative %: 64 %
Platelets: 288 10*3/uL (ref 150–400)
RBC: 5.25 MIL/uL (ref 4.22–5.81)
RDW: 12 % (ref 11.5–15.5)
WBC: 12.5 10*3/uL — ABNORMAL HIGH (ref 4.0–10.5)

## 2017-10-14 LAB — GROUP A STREP BY PCR: GROUP A STREP BY PCR: NOT DETECTED

## 2017-10-14 MED ORDER — POTASSIUM CHLORIDE CRYS ER 20 MEQ PO TBCR
40.0000 meq | EXTENDED_RELEASE_TABLET | Freq: Once | ORAL | Status: AC
  Administered 2017-10-14: 40 meq via ORAL
  Filled 2017-10-14: qty 2

## 2017-10-14 MED ORDER — BENZONATATE 100 MG PO CAPS: 100.0000 mg | ORAL_CAPSULE | Freq: Three times a day (TID) | ORAL | 0 refills | Status: DC | PRN

## 2017-10-14 MED ORDER — AZITHROMYCIN 250 MG PO TABS: 250.0000 mg | ORAL_TABLET | Freq: Every day | ORAL | 0 refills | Status: DC

## 2017-10-14 MED ORDER — HYDROCOD POLST-CPM POLST ER 10-8 MG/5ML PO SUER: 5.0000 mL | Freq: Two times a day (BID) | ORAL | 0 refills | Status: DC | PRN

## 2017-10-14 NOTE — ED Provider Notes (Addendum)
[facility] Provider Note   CSN: [id number] Arrival date & time: 10/13/17  2356     History   Chief Complaint Chief Complaint  Patient presents with  . Cough    HPI Eduardo Savage is a 37 y.o. male.  HPI  37 year old male with a history of HIV (most recent CD4 470) presents with cough. Cough has been present for 3 weeks. A couple times he's coughed so hard he has briefly passed out (~5-10 seconds). Most recent time 2 days ago. Suffered small abrasions to left forehead. No headache, vomiting or weakness. Some chest pain with coughing. Felt short of breath at first, that has resolved. No hemoptysis. No productive cough or fevers. Has tried mucinex with no relief. Feels like he's losing his voice.  Past Medical History:  Diagnosis Date  . HIV infection [facility](HCC)     Patient Active Problem List   Diagnosis Date Noted  . Screening examination for venereal disease 04/24/2015  . Encounter for long-term (current) use of medications 04/24/2015  . Human immunodeficiency virus (HIV) disease (HCC) 02/23/2014  . Depression 02/08/2014    History reviewed. No pertinent surgical history.      Home Medications    Prior to Admission medications   Medication Sig Start Date End Date Taking? Authorizing Provider  GENVOYA 150-150-200-10 MG TABS tablet TAKE 1 TABLET BY MOUTH DAILY WITH BREAKFAST 10/01/17   Comer, Belia Hemanobert W, MD    Family History Family History  Problem Relation Age of Onset  . Diabetes Father     Social History Social History   Tobacco Use  . Smoking status: Former Smoker    Years: 4.00    Last attempt to quit: 01/24/2014    Years since quitting: 3.7  . Smokeless tobacco: Former NeurosurgeonUser    Types: Snuff, Chew  . Tobacco comment: congratulated!  Substance Use Topics  . Alcohol use: Yes    Alcohol/week: 2.4 oz    Types: 4 Cans of beer per week  . Drug use: No     Allergies   Patient has no known allergies.   Review of  Systems Review of Systems  Constitutional: Negative for fever.  HENT: Positive for voice change. Negative for congestion.   Respiratory: Positive for cough. Negative for shortness of breath.   Cardiovascular: Positive for chest pain (with cough).  Gastrointestinal: Negative for abdominal pain and vomiting.  Neurological: Positive for syncope (after prolonged coughing). Negative for headaches.  All other systems reviewed and are negative.    Physical Exam Updated Vital Signs BP 109/73 (BP Location: Right Arm)   Pulse (!) 55   Temp 97.8 F (36.6 C) (Oral)   Resp 18   Ht 6' (1.829 m)   Wt 91.2 kg (201 lb)   SpO2 100%   BMI 27.26 kg/m   Physical Exam  Constitutional: He is oriented to person, place, and time. He appears well-developed and well-nourished. No distress.  HENT:  Head: Normocephalic.    Right Ear: External ear normal.  Left Ear: External ear normal.  Nose: Nose normal.  Mouth/Throat: No oropharyngeal exudate.  Eyes: Right eye exhibits no discharge. Left eye exhibits no discharge.  Neck: Neck supple.  Cardiovascular: Normal rate, regular rhythm and normal heart sounds.  No murmur heard. Pulmonary/Chest: Effort normal and breath sounds normal. He has no wheezes. He has no rales.  Abdominal: Soft. He exhibits no distension. There is no tenderness.  Musculoskeletal: He exhibits no edema.  Neurological: He  is alert and oriented to person, place, and time.  Skin: Skin is warm and dry. He is not diaphoretic.  Nursing note and vitals reviewed.    ED Treatments / Results  Labs (all labs ordered are listed, but only abnormal results are displayed) Labs Reviewed  COMPREHENSIVE METABOLIC PANEL - Abnormal; Notable for the following components:      Result Value   Potassium 3.2 (*)    Glucose, Bld 137 (*)    Calcium 8.4 (*)    All other components within normal limits  CBC WITH DIFFERENTIAL/PLATELET - Abnormal; Notable for the following components:   WBC 12.5 (*)     Neutro Abs 7.9 (*)    All other components within normal limits  GROUP A STREP BY PCR    EKG None ED ECG REPORT   Date: 10/14/2017  Rate: 59  Rhythm: sinus bradycardia  QRS Axis: normal  Intervals: normal  ST/T Wave abnormalities: nonspecific T wave changes  Conduction Disutrbances:none  Narrative Interpretation:   Old EKG Reviewed: none available  I have personally reviewed the EKG tracing and agree with the computerized printout as noted.   Radiology Dg Chest 2 View  Result Date: 10/14/2017 CLINICAL DATA:  Chest pain and cough EXAM: CHEST - 2 VIEW COMPARISON:  None. FINDINGS: There is shallow lung inflation. The cardiomediastinal contours are normal. Mild peribronchial opacity without consolidation or edema. There is no pleural effusion or pneumothorax. IMPRESSION: Mild peribronchial coarsening may indicate chronic bronchitis. No focal airspace consolidation. Electronically Signed   By: Deatra Robinson M.D.   On: 10/14/2017 01:49    Procedures Procedures (including critical care time)  Medications Ordered in ED Medications  potassium chloride SA (K-DUR,KLOR-CON) CR tablet 40 mEq (has no administration in time range)     Initial Impression / Assessment and Plan / ED Course  I have reviewed the triage vital signs and the nursing notes.  Pertinent labs & imaging results that were available during my care of the patient were reviewed by me and considered in my medical decision making (see chart for details).     Patient has no obvious bacterial pneumonia. He is not hypoxic or having increased work of breathing. No wheezes. Appears well. The syncope is almost undoubtedly from prolonged hard coughing. ECG without concerning findings. I doubt PCP pneumonia in this setting. He is afebrile, and labs show mild leukocytosis and mild hypokalemia. K repleted. He does not appear significantly ill. Will treat cough, and cover with azithro for atypical pathogens, though this could be  viral as well. However given lower concern for PCP I don't think Bactrim needed at this time. Needs to f/u with ID. Return precautions.  Final Clinical Impressions(s) / ED Diagnoses   Final diagnoses:  Cough    ED Discharge Orders    None       Pricilla Loveless, MD 10/14/17 1610    Pricilla Loveless, MD 10/27/17 2101

## 2017-10-14 NOTE — ED Triage Notes (Signed)
Pt reports a cough for 3X weeks. Pt states he coughs so hard that he feels like he is going to pass out.  Pt stable in triage.

## 2017-10-14 NOTE — Discharge Instructions (Addendum)
If you develop worsening cough, shortness of breath, coughing up blood, fevers, or other new/concerning symptoms, then return to the ER for evaluation. Otherwise follow up with your doctor as listed.

## 2017-10-21 ENCOUNTER — Ambulatory Visit: Payer: Self-pay | Admitting: Internal Medicine

## 2017-10-27 ENCOUNTER — Encounter: Payer: Self-pay | Admitting: Internal Medicine

## 2017-10-27 ENCOUNTER — Other Ambulatory Visit: Payer: Self-pay

## 2017-10-27 ENCOUNTER — Ambulatory Visit (INDEPENDENT_AMBULATORY_CARE_PROVIDER_SITE_OTHER): Payer: Self-pay | Admitting: Internal Medicine

## 2017-10-27 VITALS — BP 110/69 | HR 54 | Temp 97.9°F | Ht 72.0 in | Wt 213.0 lb

## 2017-10-27 DIAGNOSIS — B20 Human immunodeficiency virus [HIV] disease: Secondary | ICD-10-CM

## 2017-10-27 DIAGNOSIS — Z23 Encounter for immunization: Secondary | ICD-10-CM

## 2017-10-27 DIAGNOSIS — Z7189 Other specified counseling: Secondary | ICD-10-CM

## 2017-10-27 DIAGNOSIS — Z113 Encounter for screening for infections with a predominantly sexual mode of transmission: Secondary | ICD-10-CM

## 2017-10-27 DIAGNOSIS — Z79899 Other long term (current) drug therapy: Secondary | ICD-10-CM

## 2017-10-27 DIAGNOSIS — Z7185 Encounter for immunization safety counseling: Secondary | ICD-10-CM

## 2017-10-27 NOTE — Assessment & Plan Note (Signed)
Will check lipids. 

## 2017-10-27 NOTE — Progress Notes (Signed)
   Subjective:    Patient ID: Eduardo Savage, male    DOB: December 18, 1980, 37 y.o.   MRN: [id number]  HPI Here for follow up of HIV Has been on Genvoya and no missed doses.  He did recently fall from a coughing fit from bronchitis which is improving.  He did hit his head and went to the ED.  Otherwise no associated rash, diarrhea.  No new issues.  Not sexually active.    Review of Systems  Constitutional: Negative for fatigue and unexpected weight change.  Gastrointestinal: Negative for nausea.       Objective:   Physical Exam  Constitutional: He appears well-developed and well-nourished. No distress.  HENT:  Mouth/Throat: No oropharyngeal exudate.  Eyes: No scleral icterus.  Cardiovascular: Normal rate, regular rhythm and normal heart sounds.  No murmur heard. Pulmonary/Chest: Effort normal and breath sounds normal. No respiratory distress.  Skin: No rash noted.    SH: works as a Engineer, drillingchef      Assessment & Plan:

## 2017-10-27 NOTE — Assessment & Plan Note (Signed)
Doing well.  Labs today and rtc 6 months.  

## 2017-10-27 NOTE — Assessment & Plan Note (Signed)
Will screen today 

## 2017-10-28 LAB — BASIC METABOLIC PANEL
BUN: 9 mg/dL (ref 7–25)
CHLORIDE: 106 mmol/L (ref 98–110)
CO2: 23 mmol/L (ref 20–32)
Calcium: 8.3 mg/dL — ABNORMAL LOW (ref 8.6–10.3)
Creat: 1.06 mg/dL (ref 0.60–1.35)
Glucose, Bld: 121 mg/dL — ABNORMAL HIGH (ref 65–99)
Potassium: 3.3 mmol/L — ABNORMAL LOW (ref 3.5–5.3)
SODIUM: 137 mmol/L (ref 135–146)

## 2017-10-28 LAB — LIPID PANEL
Cholesterol: 191 mg/dL (ref <200)
HDL: 32 mg/dL — AB (ref >40)
LDL Cholesterol (Calc): 127 mg/dL (calc) — ABNORMAL HIGH
NON-HDL CHOLESTEROL (CALC): 159 mg/dL — AB (ref <130)
TRIGLYCERIDES: 208 mg/dL — AB (ref <150)
Total CHOL/HDL Ratio: 6 (calc) — ABNORMAL HIGH (ref <5.0)

## 2017-10-28 LAB — T-HELPER CELL (CD4) - (RCID CLINIC ONLY)
CD4 % Helper T Cell: 21 % — ABNORMAL LOW (ref 33–55)
CD4 T CELL ABS: 520 /uL (ref 400–2700)

## 2017-10-28 LAB — RPR: RPR: NONREACTIVE

## 2017-10-29 LAB — HIV-1 RNA QUANT-NO REFLEX-BLD
HIV 1 RNA QUANT: DETECTED {copies}/mL — AB
HIV-1 RNA QUANT, LOG: DETECTED {Log_copies}/mL — AB

## 2017-11-04 ENCOUNTER — Ambulatory Visit: Payer: Self-pay

## 2017-11-12 ENCOUNTER — Other Ambulatory Visit: Payer: Self-pay | Admitting: Internal Medicine

## 2017-11-12 DIAGNOSIS — B20 Human immunodeficiency virus [HIV] disease: Secondary | ICD-10-CM

## 2017-11-26 ENCOUNTER — Ambulatory Visit: Payer: Self-pay

## 2017-12-02 ENCOUNTER — Other Ambulatory Visit: Payer: Self-pay | Admitting: Internal Medicine

## 2017-12-02 DIAGNOSIS — B20 Human immunodeficiency virus [HIV] disease: Secondary | ICD-10-CM

## 2018-01-29 ENCOUNTER — Ambulatory Visit: Payer: Self-pay

## 2018-02-01 ENCOUNTER — Encounter: Payer: Self-pay | Admitting: Internal Medicine

## 2018-04-29 ENCOUNTER — Other Ambulatory Visit: Payer: Self-pay

## 2018-05-13 ENCOUNTER — Encounter: Payer: Self-pay | Admitting: Internal Medicine

## 2018-05-19 ENCOUNTER — Encounter: Payer: Self-pay | Admitting: Internal Medicine

## 2018-05-20 ENCOUNTER — Other Ambulatory Visit: Payer: Self-pay

## 2018-05-20 DIAGNOSIS — B20 Human immunodeficiency virus [HIV] disease: Secondary | ICD-10-CM

## 2018-05-21 LAB — T-HELPER CELL (CD4) - (RCID CLINIC ONLY)
CD4 T CELL ABS: 460 /uL (ref 400–2700)
CD4 T CELL HELPER: 24 % — AB (ref 33–55)

## 2018-05-22 LAB — COMPLETE METABOLIC PANEL WITH GFR
AG Ratio: 1.4 (calc) (ref 1.0–2.5)
ALKALINE PHOSPHATASE (APISO): 109 U/L (ref 40–115)
ALT: 16 U/L (ref 9–46)
AST: 13 U/L (ref 10–40)
Albumin: 4.3 g/dL (ref 3.6–5.1)
BILIRUBIN TOTAL: 0.3 mg/dL (ref 0.2–1.2)
BUN: 13 mg/dL (ref 7–25)
CHLORIDE: 105 mmol/L (ref 98–110)
CO2: 24 mmol/L (ref 20–32)
Calcium: 8.8 mg/dL (ref 8.6–10.3)
Creat: 0.89 mg/dL (ref 0.60–1.35)
GFR, Est African American: 127 mL/min/{1.73_m2} (ref >=60)
GFR, Est Non African American: 110 mL/min/{1.73_m2} (ref >=60)
GLUCOSE: 107 mg/dL — AB (ref 65–99)
Globulin: 3 g/dL (calc) (ref 1.9–3.7)
Potassium: 4 mmol/L (ref 3.5–5.3)
Sodium: 136 mmol/L (ref 135–146)
Total Protein: 7.3 g/dL (ref 6.1–8.1)

## 2018-05-22 LAB — CBC WITH DIFFERENTIAL/PLATELET
Absolute Monocytes: 640 cells/uL (ref 200–950)
BASOS ABS: 70 {cells}/uL (ref 0–200)
BASOS PCT: 0.9 %
EOS PCT: 3.6 %
Eosinophils Absolute: 281 cells/uL (ref 15–500)
HEMATOCRIT: 41.7 % (ref 38.5–50.0)
HEMOGLOBIN: 14.3 g/dL (ref 13.2–17.1)
LYMPHS ABS: 2036 {cells}/uL (ref 850–3900)
MCH: 28.7 pg (ref 27.0–33.0)
MCHC: 34.3 g/dL (ref 32.0–36.0)
MCV: 83.7 fL (ref 80.0–100.0)
MONOS PCT: 8.2 %
MPV: 10.3 fL (ref 7.5–12.5)
NEUTROS ABS: 4774 {cells}/uL (ref 1500–7800)
Neutrophils Relative %: 61.2 %
Platelets: 251 10*3/uL (ref 140–400)
RBC: 4.98 10*6/uL (ref 4.20–5.80)
RDW: 11.8 % (ref 11.0–15.0)
Total Lymphocyte: 26.1 %
WBC: 7.8 10*3/uL (ref 3.8–10.8)

## 2018-05-22 LAB — HIV-1 RNA QUANT-NO REFLEX-BLD
HIV 1 RNA QUANT: NOT DETECTED {copies}/mL
HIV-1 RNA Quant, Log: <1.3 Log copies/mL

## 2018-05-25 ENCOUNTER — Encounter: Payer: Self-pay | Admitting: Internal Medicine

## 2018-05-29 ENCOUNTER — Emergency Department (HOSPITAL_COMMUNITY): Payer: Self-pay

## 2018-05-29 ENCOUNTER — Encounter (HOSPITAL_COMMUNITY): Payer: Self-pay | Admitting: Emergency Medicine

## 2018-05-29 ENCOUNTER — Emergency Department (HOSPITAL_COMMUNITY)
Admission: EM | Admit: 2018-05-29 | Discharge: 2018-05-29 | Disposition: A | Discharge status: Home / Self Care | Payer: Self-pay | Attending: Emergency Medicine | Admitting: Emergency Medicine

## 2018-05-29 ENCOUNTER — Other Ambulatory Visit: Payer: Self-pay

## 2018-05-29 DIAGNOSIS — Z21 Asymptomatic human immunodeficiency virus [HIV] infection status: Secondary | ICD-10-CM | POA: Insufficient documentation

## 2018-05-29 DIAGNOSIS — Z79899 Other long term (current) drug therapy: Secondary | ICD-10-CM | POA: Insufficient documentation

## 2018-05-29 DIAGNOSIS — Z87891 Personal history of nicotine dependence: Secondary | ICD-10-CM | POA: Insufficient documentation

## 2018-05-29 DIAGNOSIS — N23 Unspecified renal colic: Secondary | ICD-10-CM | POA: Insufficient documentation

## 2018-05-29 LAB — CBC
HEMATOCRIT: 42.1 % (ref 39.0–52.0)
Hemoglobin: 13.8 g/dL (ref 13.0–17.0)
MCH: 27.9 pg (ref 26.0–34.0)
MCHC: 32.8 g/dL (ref 30.0–36.0)
MCV: 85.2 fL (ref 80.0–100.0)
Platelets: 232 10*3/uL (ref 150–400)
RBC: 4.94 MIL/uL (ref 4.22–5.81)
RDW: 11.4 % — AB (ref 11.5–15.5)
WBC: 12.6 10*3/uL — AB (ref 4.0–10.5)
nRBC: 0 % (ref 0.0–0.2)

## 2018-05-29 LAB — COMPREHENSIVE METABOLIC PANEL
ALT: 90 U/L — AB (ref 0–44)
AST: 34 U/L (ref 15–41)
Albumin: 4.3 g/dL (ref 3.5–5.0)
Alkaline Phosphatase: 112 U/L (ref 38–126)
Anion gap: 9 (ref 5–15)
BUN: 17 mg/dL (ref 6–20)
CO2: 25 mmol/L (ref 22–32)
CREATININE: 0.96 mg/dL (ref 0.61–1.24)
Calcium: 8.9 mg/dL (ref 8.9–10.3)
Chloride: 103 mmol/L (ref 98–111)
GFR calc Af Amer: >60 mL/min (ref >60)
Glucose, Bld: 114 mg/dL — ABNORMAL HIGH (ref 70–99)
Potassium: 3.4 mmol/L — ABNORMAL LOW (ref 3.5–5.1)
Sodium: 137 mmol/L (ref 135–145)
TOTAL PROTEIN: 7.8 g/dL (ref 6.5–8.1)
Total Bilirubin: 0.3 mg/dL (ref 0.3–1.2)

## 2018-05-29 LAB — URINALYSIS, ROUTINE W REFLEX MICROSCOPIC
Bilirubin Urine: NEGATIVE
GLUCOSE, UA: NEGATIVE mg/dL
Ketones, ur: NEGATIVE mg/dL
NITRITE: NEGATIVE
PROTEIN: 30 mg/dL — AB
Specific Gravity, Urine: 1.009 (ref 1.005–1.030)
WBC, UA: >50 WBC/hpf — ABNORMAL HIGH (ref 0–5)
pH: 6 (ref 5.0–8.0)

## 2018-05-29 LAB — LIPASE, BLOOD: Lipase: 35 U/L (ref 11–51)

## 2018-05-29 LAB — LACTIC ACID, PLASMA: Lactic Acid, Venous: 1.3 mmol/L (ref 0.5–1.9)

## 2018-05-29 MED ORDER — KETOROLAC TROMETHAMINE 15 MG/ML IJ SOLN
15.0000 mg | Freq: Once | INTRAMUSCULAR | Status: AC
  Administered 2018-05-29: 15 mg via INTRAVENOUS
  Filled 2018-05-29: qty 1

## 2018-05-29 MED ORDER — HYDROCODONE-ACETAMINOPHEN 5-325 MG PO TABS: 1.0000 | ORAL_TABLET | ORAL | 0 refills | Status: AC | PRN

## 2018-05-29 MED ORDER — ONDANSETRON HCL 4 MG/2ML IJ SOLN
4.0000 mg | Freq: Once | INTRAMUSCULAR | Status: AC
  Administered 2018-05-29: 4 mg via INTRAVENOUS
  Filled 2018-05-29: qty 2

## 2018-05-29 MED ORDER — SODIUM CHLORIDE 0.9 % IV BOLUS
1000.0000 mL | Freq: Once | INTRAVENOUS | Status: AC
  Administered 2018-05-29: 1000 mL via INTRAVENOUS

## 2018-05-29 MED ORDER — HYDROCODONE-ACETAMINOPHEN 5-325 MG PO TABS: 2.0000 | ORAL_TABLET | Freq: Once | ORAL | Status: DC

## 2018-05-29 MED ORDER — SODIUM CHLORIDE 0.9 % IV SOLN
2.0000 g | Freq: Once | INTRAVENOUS | Status: AC
  Administered 2018-05-29: 2 g via INTRAVENOUS
  Filled 2018-05-29: qty 20

## 2018-05-29 MED ORDER — MORPHINE SULFATE (PF) 4 MG/ML IV SOLN
6.0000 mg | Freq: Once | INTRAVENOUS | Status: AC
  Administered 2018-05-29: 6 mg via INTRAVENOUS
  Filled 2018-05-29: qty 2

## 2018-05-29 MED ORDER — TAMSULOSIN HCL 0.4 MG PO CAPS: 0.4000 mg | ORAL_CAPSULE | Freq: Every day | ORAL | 0 refills | Status: AC

## 2018-05-29 MED ORDER — ONDANSETRON 4 MG PO TBDP: 4.0000 mg | ORAL_TABLET | Freq: Three times a day (TID) | ORAL | 0 refills | Status: AC | PRN

## 2018-05-29 MED ORDER — CIPROFLOXACIN HCL 500 MG PO TABS: 500.0000 mg | ORAL_TABLET | Freq: Two times a day (BID) | ORAL | 0 refills | Status: AC

## 2018-05-29 MED ORDER — SODIUM CHLORIDE 0.9% FLUSH
3.0000 mL | Freq: Once | INTRAVENOUS | Status: AC
  Administered 2018-05-29: 3 mL via INTRAVENOUS

## 2018-05-29 NOTE — ED Provider Notes (Signed)
[facility]-EMERGENCY DEPT Provider Note   CSN: [id number] Arrival date & time: 05/29/18  [id number]     History   Chief Complaint Chief Complaint  Patient presents with  . Fever  . Abdominal Pain    HPI Eduardo Savage is a 38 y.o. male.  HPI 38 year old male with history of HIV here with left flank pain.  The patient states that for the last several days, he has had gradual onset of progressively worsening aching, throbbing, left flank pain.  Pain seems to come and go intermittently.  Is associated with mild nausea but no vomiting.  He is also had change in his bowel habits, with moderate constipation, though he did have a normal bowel movement yesterday.  He is not had any fevers until this morning, when he began to feel chills.  Denies any hematuria or frequency.  No history of kidney stones.  History of diverticulitis.  Pain is worse with palpation and movement.  No alleviating factors.  Past Medical History:  Diagnosis Date  . HIV infection [facility](HCC)     Patient Active Problem List   Diagnosis Date Noted  . Vaccine counseling 10/27/2017  . Screening examination for venereal disease 04/24/2015  . Encounter for long-term (current) use of medications 04/24/2015  . Human immunodeficiency virus (HIV) disease (HCC) 02/23/2014  . Depression 02/08/2014    History reviewed. No pertinent surgical history.      Home Medications    Prior to Admission medications   Medication Sig Start Date End Date Taking? Authorizing Provider  GENVOYA 150-150-200-10 MG TABS tablet TAKE 1 TABLET BY MOUTH DAILY WITH BREAKFAST Patient taking differently: Take 1 tablet by mouth daily.  12/02/17  Yes Comer, Belia Hemanobert W, MD  azithromycin (ZITHROMAX) 250 MG tablet Take 1 tablet (250 mg total) by mouth daily. Take first 2 tablets together, then 1 every day until finished. Patient not taking: Reported on 10/27/2017 10/14/17   Pricilla LovelessGoldston, Scott, MD  benzonatate (TESSALON) 100 MG capsule Take 1  capsule (100 mg total) by mouth 3 (three) times daily as needed for cough. Patient not taking: Reported on 10/27/2017 10/14/17   Pricilla LovelessGoldston, Scott, MD  chlorpheniramine-HYDROcodone [facility](TUSSIONEX PENNKINETIC ER) 10-8 MG/5ML SUER Take 5 mLs by mouth every 12 (twelve) hours as needed for cough. Patient not taking: Reported on 05/29/2018 10/14/17   Pricilla LovelessGoldston, Scott, MD  ciprofloxacin (CIPRO) 500 MG tablet Take 1 tablet (500 mg total) by mouth 2 (two) times daily for 10 days. 05/29/18 06/08/18  Shaune PollackIsaacs, Vraj Denardo, MD  GENVOYA 150-150-200-10 MG TABS tablet TAKE 1 TABLET BY MOUTH DAILY WITH BREAKFAST Patient not taking: Reported on 05/29/2018 11/12/17   Gardiner Barefootomer, Robert W, MD  HYDROcodone-acetaminophen (NORCO/VICODIN) 5-325 MG tablet Take 1-2 tablets by mouth every 4 (four) hours as needed for moderate pain or severe pain. 05/29/18   Shaune PollackIsaacs, Leveta Wahab, MD  ondansetron (ZOFRAN ODT) 4 MG disintegrating tablet Take 1 tablet (4 mg total) by mouth every 8 (eight) hours as needed for nausea or vomiting. 05/29/18   Shaune PollackIsaacs, Renatta Shrieves, MD  tamsulosin (FLOMAX) 0.4 MG CAPS capsule Take 1 capsule (0.4 mg total) by mouth daily for 7 days. 05/29/18 06/05/18  Shaune PollackIsaacs, Chanelle Hodsdon, MD    Family History Family History  Problem Relation Age of Onset  . Diabetes Father     Social History Social History   Tobacco Use  . Smoking status: Former Smoker    Years: 4.00    Last attempt to quit: 01/24/2014    Years since quitting: 4.3  . Smokeless  tobacco: Former Neurosurgeon    Types: Snuff, Chew  . Tobacco comment: congratulated!  Substance Use Topics  . Alcohol use: Yes    Alcohol/week: 4.0 standard drinks    Types: 4 Cans of beer per week  . Drug use: No     Allergies   Patient has no known allergies.   Review of Systems Review of Systems  Constitutional: Positive for fatigue and fever. Negative for chills.  HENT: Negative for congestion and rhinorrhea.   Eyes: Negative for visual disturbance.  Respiratory: Negative for cough, shortness of  breath and wheezing.   Cardiovascular: Negative for chest pain and leg swelling.  Gastrointestinal: Positive for nausea. Negative for abdominal pain, diarrhea and vomiting.  Genitourinary: Positive for flank pain. Negative for dysuria.  Musculoskeletal: Negative for neck pain and neck stiffness.  Skin: Negative for rash and wound.  Allergic/Immunologic: Negative for immunocompromised state.  Neurological: Negative for syncope, weakness and headaches.  All other systems reviewed and are negative.    Physical Exam Updated Vital Signs BP 114/64   Pulse 66   Temp 98.2 F (36.8 C) (Oral)   Resp 16   Ht 6\' 1"  (1.854 m)   Wt 90.7 kg   SpO2 98%   BMI 26.39 kg/m   Physical Exam Vitals signs and nursing note reviewed.  Constitutional:      General: He is in acute distress.     Appearance: He is well-developed.  HENT:     Head: Normocephalic and atraumatic.  Eyes:     Conjunctiva/sclera: Conjunctivae normal.  Neck:     Musculoskeletal: Neck supple.  Cardiovascular:     Rate and Rhythm: Normal rate and regular rhythm.     Heart sounds: Normal heart sounds. No murmur. No friction rub.  Pulmonary:     Effort: Pulmonary effort is normal. No respiratory distress.     Breath sounds: Normal breath sounds. No wheezing or rales.  Abdominal:     General: There is no distension.     Palpations: Abdomen is soft.     Tenderness: There is abdominal tenderness in the suprapubic area, left upper quadrant and left lower quadrant. There is left CVA tenderness. There is no guarding or rebound.  Skin:    General: Skin is warm.     Capillary Refill: Capillary refill takes less than 2 seconds.  Neurological:     Mental Status: He is alert and oriented to person, place, and time.     Motor: No abnormal muscle tone.      ED Treatments / Results  Labs (all labs ordered are listed, but only abnormal results are displayed) Labs Reviewed  COMPREHENSIVE METABOLIC PANEL - Abnormal; Notable for the  following components:      Result Value   Potassium 3.4 (*)    Glucose, Bld 114 (*)    ALT 90 (*)    All other components within normal limits  CBC - Abnormal; Notable for the following components:   WBC 12.6 (*)    RDW 11.4 (*)    All other components within normal limits  URINALYSIS, ROUTINE W REFLEX MICROSCOPIC - Abnormal; Notable for the following components:   APPearance CLOUDY (*)    Hgb urine dipstick MODERATE (*)    Protein, ur 30 (*)    Leukocytes, UA LARGE (*)    WBC, UA >50 (*)    Bacteria, UA FEW (*)    All other components within normal limits  CULTURE, BLOOD (ROUTINE X 2)  CULTURE, BLOOD (  ROUTINE X 2)  URINE CULTURE  LIPASE, BLOOD  LACTIC ACID, PLASMA    EKG None  Radiology Ct Renal Stone Study  Result Date: 05/29/2018 CLINICAL DATA:  Left-sided abdominal and flank pain beginning yesterday. Low-grade fever. EXAM: CT ABDOMEN AND PELVIS WITHOUT CONTRAST TECHNIQUE: Multidetector CT imaging of the abdomen and pelvis was performed following the standard protocol without IV contrast. COMPARISON:  None. FINDINGS: Lower chest: No acute findings. Hepatobiliary: No mass visualized on this unenhanced exam. Gallbladder is unremarkable. Pancreas: No mass or inflammatory process visualized on this unenhanced exam. Spleen:  Within normal limits in size. Adrenals/Urinary tract: Mild left hydronephrosis is seen due to 3 mm calculus in proximal left ureter. Stomach/Bowel: No evidence of obstruction, inflammatory process, or abnormal fluid collections. Normal appendix visualized. Vascular/Lymphatic: No pathologically enlarged lymph nodes identified. No evidence of abdominal aortic aneurysm. Reproductive:  No mass or other significant abnormality. Other:  None. Musculoskeletal:  No suspicious bone lesions identified. IMPRESSION: Mild left hydronephrosis due to 3 mm proximal left ureteral calculus. Electronically Signed   By: Myles RosenthalJohn  Stahl M.D.   On: 05/29/2018 09:05     Procedures Procedures (including critical care time)  Medications Ordered in ED Medications  HYDROcodone-acetaminophen (NORCO/VICODIN) 5-325 MG per tablet 2 tablet (2 tablets Oral Refused 05/29/18 1141)  sodium chloride flush (NS) 0.9 % injection 3 mL (3 mLs Intravenous Given 05/29/18 0658)  sodium chloride 0.9 % bolus 1,000 mL (0 mLs Intravenous Stopped 05/29/18 1035)  morphine 4 MG/ML injection 6 mg (6 mg Intravenous Given 05/29/18 0906)  ondansetron (ZOFRAN) injection 4 mg (4 mg Intravenous Given 05/29/18 0905)  cefTRIAXone (ROCEPHIN) 2 g in sodium chloride 0.9 % 100 mL IVPB (0 g Intravenous Stopped 05/29/18 0940)  ketorolac (TORADOL) 15 MG/ML injection 15 mg (15 mg Intravenous Given 05/29/18 0906)     Initial Impression / Assessment and Plan / ED Course  I have reviewed the triage vital signs and the nursing notes.  Pertinent labs & imaging results that were available during my care of the patient were reviewed by me and considered in my medical decision making (see chart for details).     38 year old male here with left flank pain.  Imaging shows 3 mm stone.  Patient did have initial low-grade temperature here, with mild leukocytosis, and pyuria on UA.  However, he has no hypotension, normal lactic acid, and no evidence to suggest sepsis.  He appears remarkably well and pain is completely controlled after analgesics here in the ED.  Urology consulted and has independently evaluated the patient.  They had a long, shared decision-making discussion regarding risks and benefits of management options, and patient would like to manage as an outpatient at home.  Urology recommends fluoroquinolone, analgesics, and outpatient follow-up.  This was discussed with patient who is in agreement.  He understands the need to return with any worsening fever, pain, vomiting, or other concerning symptoms.  Final Clinical Impressions(s) / ED Diagnoses   Final diagnoses:  Renal colic    ED Discharge  Orders         Ordered    ciprofloxacin (CIPRO) 500 MG tablet  2 times daily     05/29/18 1058    ondansetron (ZOFRAN ODT) 4 MG disintegrating tablet  Every 8 hours PRN     05/29/18 1058    HYDROcodone-acetaminophen (NORCO/VICODIN) 5-325 MG tablet  Every 4 hours PRN     05/29/18 1058    tamsulosin (FLOMAX) 0.4 MG CAPS capsule  Daily  05/29/18 1101           Shaune Pollack, MD 05/29/18 1220

## 2018-05-29 NOTE — Discharge Instructions (Signed)
Return to the ER immediately if:  - You have persistent fevers - You begin vomiting uncontrollably - You have worsening pain - Any other concerning symptoms  Drink plenty of fluids.

## 2018-05-29 NOTE — Consult Note (Signed)
Urology Consult Note   Requesting Attending Physician:  Shaune Pollack, MD Service Providing Consult: Urology  Consulting Attending: Alfredo Martinez, MD   Reason for Consult: kidney stone  HPI: Eduardo Savage is seen in consultation for reasons noted above at the request of Shaune Pollack, MD.  This is a 38 y.o. male with history of well-controlled HIV presenting with 2 days of left sided flank pain. Work up in the ED consistent with obstructing 60mm proximal left ureteral stone.  Patient denies fevers/chills, nausea/vomiting, hematuria, dysuria or concern for UTI. No prior urologic history.   Past Medical History: Past Medical History:  Diagnosis Date  . HIV infection [facility])     Past Surgical History:  History reviewed. No pertinent surgical history.  Medication: No current facility-administered medications for this encounter.    Current Outpatient Medications  Medication Sig Dispense Refill  . GENVOYA 150-150-200-10 MG TABS tablet TAKE 1 TABLET BY MOUTH DAILY WITH BREAKFAST (Patient taking differently: Take 1 tablet by mouth daily. ) 30 tablet 5  . azithromycin (ZITHROMAX) 250 MG tablet Take 1 tablet (250 mg total) by mouth daily. Take first 2 tablets together, then 1 every day until finished. (Patient not taking: Reported on 10/27/2017) 6 tablet 0  . benzonatate (TESSALON) 100 MG capsule Take 1 capsule (100 mg total) by mouth 3 (three) times daily as needed for cough. (Patient not taking: Reported on 10/27/2017) 21 capsule 0  . chlorpheniramine-HYDROcodone (TUSSIONEX PENNKINETIC ER) 10-8 MG/5ML SUER Take 5 mLs by mouth every 12 (twelve) hours as needed for cough. (Patient not taking: Reported on 05/29/2018) 60 mL 0  . GENVOYA 150-150-200-10 MG TABS tablet TAKE 1 TABLET BY MOUTH DAILY WITH BREAKFAST (Patient not taking: Reported on 05/29/2018) 30 tablet 5    Allergies: No Known Allergies  Social History: Social History   Tobacco Use  . Smoking status: Former Smoker   Years: 4.00    Last attempt to quit: 01/24/2014    Years since quitting: 4.3  . Smokeless tobacco: Former Neurosurgeon    Types: Snuff, Chew  . Tobacco comment: congratulated!  Substance Use Topics  . Alcohol use: Yes    Alcohol/week: 4.0 standard drinks    Types: 4 Cans of beer per week  . Drug use: No    Family History Family History  Problem Relation Age of Onset  . Diabetes Father     Review of Systems 10 systems were reviewed and are negative except as noted specifically in the HPI.  Objective   Vital signs in last 24 hours: BP 122/75   Pulse 61   Temp 98.2 F (36.8 C) (Oral)   Resp 16   Ht 6\' 1"  (1.854 m)   Wt 90.7 kg   SpO2 99%   BMI 26.39 kg/m   Physical Exam General: NAD, A&O, resting, appropriate HEENT: [place]/AT, EOMI, MMM Pulmonary: Normal work of breathing Cardiovascular: HDS, adequate peripheral perfusion Abdomen: Soft, NTTP, nondistended. GU: no CVAT Extremities: warm and well perfused Neuro: Appropriate, no focal neurological deficits  Most Recent Labs: Lab Results  Component Value Date   WBC 12.6 (H) 05/29/2018   HGB 13.8 05/29/2018   HCT 42.1 05/29/2018   PLT 232 05/29/2018    Lab Results  Component Value Date   NA 137 05/29/2018   K 3.4 (L) 05/29/2018   CL 103 05/29/2018   CO2 25 05/29/2018   BUN 17 05/29/2018   CREATININE 0.96 05/29/2018   CALCIUM 8.9 05/29/2018    No results found for: INR, APTT  IMAGING: Ct Renal Stone Study  Result Date: 05/29/2018 CLINICAL DATA:  Left-sided abdominal and flank pain beginning yesterday. Low-grade fever. EXAM: CT ABDOMEN AND PELVIS WITHOUT CONTRAST TECHNIQUE: Multidetector CT imaging of the abdomen and pelvis was performed following the standard protocol without IV contrast. COMPARISON:  None. FINDINGS: Lower chest: No acute findings. Hepatobiliary: No mass visualized on this unenhanced exam. Gallbladder is unremarkable. Pancreas: No mass or inflammatory process visualized on this unenhanced exam.  Spleen:  Within normal limits in size. Adrenals/Urinary tract: Mild left hydronephrosis is seen due to 3 mm calculus in proximal left ureter. Stomach/Bowel: No evidence of obstruction, inflammatory process, or abnormal fluid collections. Normal appendix visualized. Vascular/Lymphatic: No pathologically enlarged lymph nodes identified. No evidence of abdominal aortic aneurysm. Reproductive:  No mass or other significant abnormality. Other:  None. Musculoskeletal:  No suspicious bone lesions identified. IMPRESSION: Mild left hydronephrosis due to 3 mm proximal left ureteral calculus. Electronically Signed   By: Myles RosenthalJohn  Stahl M.D.   On: 05/29/2018 09:05    ------ Assessment:   38 y.o. male with history of HIV well controlled with medication presenting with obstructing 3mm proximal left ureteral stone.   Patient afebrile and HDS. Non-toxic appearing. Mild leukocytosis on 12.6. UA with negative nitrites, large LE, >50 WBCs,, few bacteria.  No dysuria. UCx pending. Cr WNL. Pain significantly improved with medication.   Discussed options with patient that include left ureteral stent placement versus trial of passage with strict return precautions as described below. We discussed the risks and benefits of each option in detail. Given the size and location of the stone as well as the fact that the patient is afebrile and HDS without signs of systemic infection, we feel that trial of passage with medical expulsive therapy is a safe option. Patient is in agreement and it is their preference to avoid additional procedures at this time.     Recommendations: - OK to discharge from urologic perspective. - Recommend sending home with PO pain and nausea medication, Flomax 0.4mg , urine strainer. - Return precautions include fevers/chills, intractable nausea/vomiting, or pain limiting self care. - Follow up urine culture results, would recommend empiric treatment with PO antibiotics pending results - Patient will  follow up with urology in 1-2 weeks, patient instructed to bring stone for chemical analysis if passed.

## 2018-05-29 NOTE — ED Triage Notes (Signed)
Patient is complaining of left abdominal pain that is radiating to the left lower back. Patient has a fever 100.4 oral. Patient states it started yesterday.

## 2018-05-31 LAB — URINE CULTURE: Culture: 70000 — AB

## 2018-06-01 ENCOUNTER — Telehealth: Payer: Self-pay | Admitting: Emergency Medicine

## 2018-06-01 NOTE — Telephone Encounter (Signed)
Post ED Visit - Positive Culture Follow-up  Culture report reviewed by antimicrobial stewardship pharmacist:  []  Enzo Bi, Pharm.D. []  Celedonio Miyamoto, Pharm.D., BCPS AQ-ID []  Garvin Fila, Pharm.D., BCPS []  Georgina Pillion, [address].D., BCPS []  [place], [address].D., BCPS, AAHIVP []  Estella Husk, Pharm.D., BCPS, AAHIVP [x]  Lysle Pearl, PharmD, BCPS []  Phillips Climes, PharmD, BCPS []  Agapito Games, PharmD, BCPS []  Verlan Friends, PharmD  Positive urine culture Treated with ciprofloxacin, organism sensitive to the same and no further patient follow-up is required at this time.  Berle Mull 06/01/2018, 12:15 PM

## 2018-06-03 LAB — CULTURE, BLOOD (ROUTINE X 2)
CULTURE: NO GROWTH
CULTURE: NO GROWTH
SPECIAL REQUESTS: ADEQUATE
Special Requests: ADEQUATE

## 2018-06-08 ENCOUNTER — Encounter: Payer: Self-pay | Admitting: Internal Medicine

## 2018-07-10 ENCOUNTER — Other Ambulatory Visit: Payer: Self-pay | Admitting: Internal Medicine

## 2018-07-10 DIAGNOSIS — B20 Human immunodeficiency virus [HIV] disease: Secondary | ICD-10-CM

## 2018-12-01 ENCOUNTER — Other Ambulatory Visit: Payer: Self-pay | Admitting: *Deleted

## 2018-12-01 DIAGNOSIS — Z113 Encounter for screening for infections with a predominantly sexual mode of transmission: Secondary | ICD-10-CM

## 2018-12-01 DIAGNOSIS — B20 Human immunodeficiency virus [HIV] disease: Secondary | ICD-10-CM

## 2018-12-06 ENCOUNTER — Other Ambulatory Visit: Payer: Self-pay

## 2018-12-07 ENCOUNTER — Other Ambulatory Visit: Payer: Self-pay

## 2018-12-14 ENCOUNTER — Other Ambulatory Visit: Payer: Self-pay

## 2018-12-14 DIAGNOSIS — B20 Human immunodeficiency virus [HIV] disease: Secondary | ICD-10-CM

## 2018-12-14 DIAGNOSIS — Z113 Encounter for screening for infections with a predominantly sexual mode of transmission: Secondary | ICD-10-CM

## 2018-12-15 LAB — T-HELPER CELL (CD4) - (RCID CLINIC ONLY)
CD4 % Helper T Cell: 21 % — ABNORMAL LOW (ref 33–65)
CD4 T Cell Abs: 318 /uL — ABNORMAL LOW (ref 400–1790)

## 2018-12-17 LAB — COMPLETE METABOLIC PANEL WITHOUT GFR
AG Ratio: 1.5 (calc) (ref 1.0–2.5)
ALT: 19 U/L (ref 9–46)
AST: 14 U/L (ref 10–40)
Albumin: 4.6 g/dL (ref 3.6–5.1)
Alkaline phosphatase (APISO): 106 U/L (ref 36–130)
BUN: 13 mg/dL (ref 7–25)
CO2: 21 mmol/L (ref 20–32)
Calcium: 9.6 mg/dL (ref 8.6–10.3)
Chloride: 106 mmol/L (ref 98–110)
Creat: 0.88 mg/dL (ref 0.60–1.35)
GFR, Est African American: 128 mL/min/{1.73_m2}
GFR, Est Non African American: 111 mL/min/{1.73_m2}
Globulin: 3 g/dL (ref 1.9–3.7)
Glucose, Bld: 131 mg/dL — ABNORMAL HIGH (ref 65–99)
Potassium: 3.9 mmol/L (ref 3.5–5.3)
Sodium: 139 mmol/L (ref 135–146)
Total Bilirubin: 0.3 mg/dL (ref 0.2–1.2)
Total Protein: 7.6 g/dL (ref 6.1–8.1)

## 2018-12-17 LAB — CBC WITH DIFFERENTIAL/PLATELET
Absolute Monocytes: 366 {cells}/uL (ref 200–950)
Basophils Absolute: 50 {cells}/uL (ref 0–200)
Basophils Relative: 0.8 %
Eosinophils Absolute: 267 {cells}/uL (ref 15–500)
Eosinophils Relative: 4.3 %
HCT: 42.7 % (ref 38.5–50.0)
Hemoglobin: 14.3 g/dL (ref 13.2–17.1)
Lymphs Abs: 1649 {cells}/uL (ref 850–3900)
MCH: 28.8 pg (ref 27.0–33.0)
MCHC: 33.5 g/dL (ref 32.0–36.0)
MCV: 86.1 fL (ref 80.0–100.0)
MPV: 9.8 fL (ref 7.5–12.5)
Monocytes Relative: 5.9 %
Neutro Abs: 3869 {cells}/uL (ref 1500–7800)
Neutrophils Relative %: 62.4 %
Platelets: 252 10*3/uL (ref 140–400)
RBC: 4.96 Million/uL (ref 4.20–5.80)
RDW: 11.9 % (ref 11.0–15.0)
Total Lymphocyte: 26.6 %
WBC: 6.2 10*3/uL (ref 3.8–10.8)

## 2018-12-17 LAB — HIV-1 RNA QUANT-NO REFLEX-BLD
HIV 1 RNA Quant: <20 copies/mL
HIV-1 RNA Quant, Log: <1.3 Log copies/mL

## 2018-12-17 LAB — RPR: RPR Ser Ql: REACTIVE — AB

## 2018-12-17 LAB — FLUORESCENT TREPONEMAL AB(FTA)-IGG-BLD: Fluorescent Treponemal ABS: NONREACTIVE

## 2018-12-17 LAB — RPR TITER: RPR Titer: 1:1 {titer} — ABNORMAL HIGH

## 2018-12-20 ENCOUNTER — Encounter: Payer: Self-pay | Admitting: Internal Medicine

## 2019-01-17 ENCOUNTER — Encounter: Payer: Self-pay | Admitting: Internal Medicine

## 2019-01-17 ENCOUNTER — Other Ambulatory Visit: Payer: Self-pay | Admitting: Internal Medicine

## 2019-01-17 ENCOUNTER — Telehealth: Payer: Self-pay

## 2019-01-17 DIAGNOSIS — B20 Human immunodeficiency virus [HIV] disease: Secondary | ICD-10-CM

## 2019-01-17 NOTE — Telephone Encounter (Signed)
Received refill request for Genvoya. Last refill was sent in on 07/2018. Patient has not been in office since 10/2017, and is overdue for an appointment for financial assistance, lab, and office visit.  Was able to reach patient and schedule appointments. Patient understands he must come to office to continue getting medication. Patient denies missing any doses of medication and states that he has 4 pills left. Pharmacy confirms patient has been picking medication from them monthly. Will send in 30 day supply for patient.  St[place]

## 2019-01-21 ENCOUNTER — Other Ambulatory Visit: Payer: Self-pay | Admitting: *Deleted

## 2019-01-21 DIAGNOSIS — B20 Human immunodeficiency virus [HIV] disease: Secondary | ICD-10-CM

## 2019-01-25 ENCOUNTER — Other Ambulatory Visit: Payer: Self-pay

## 2019-01-25 ENCOUNTER — Ambulatory Visit: Payer: Self-pay

## 2019-02-16 ENCOUNTER — Ambulatory Visit: Payer: Self-pay | Admitting: Internal Medicine

## 2019-03-30 ENCOUNTER — Other Ambulatory Visit: Payer: Self-pay | Admitting: Internal Medicine

## 2019-03-30 DIAGNOSIS — B20 Human immunodeficiency virus [HIV] disease: Secondary | ICD-10-CM

## 2019-04-13 ENCOUNTER — Other Ambulatory Visit: Payer: Self-pay

## 2019-04-14 ENCOUNTER — Other Ambulatory Visit: Payer: Self-pay | Admitting: *Deleted

## 2019-04-14 ENCOUNTER — Other Ambulatory Visit: Payer: Self-pay

## 2019-04-14 DIAGNOSIS — B20 Human immunodeficiency virus [HIV] disease: Secondary | ICD-10-CM

## 2019-04-14 MED ORDER — GENVOYA 150-150-200-10 MG PO TABS: 1.0000 | ORAL_TABLET | Freq: Every day | ORAL | 0 refills | Status: DC

## 2019-04-15 LAB — T-HELPER CELL (CD4) - (RCID CLINIC ONLY)
CD4 % Helper T Cell: 23 % — ABNORMAL LOW (ref 33–65)
CD4 T Cell Abs: 374 /uL — ABNORMAL LOW (ref 400–1790)

## 2019-04-23 LAB — HIV-1 RNA QUANT-NO REFLEX-BLD
HIV 1 RNA Quant: 159000 copies/mL — ABNORMAL HIGH
HIV-1 RNA Quant, Log: 5.2 Log copies/mL — ABNORMAL HIGH

## 2019-05-09 ENCOUNTER — Encounter: Payer: Self-pay | Admitting: Internal Medicine

## 2019-05-10 ENCOUNTER — Ambulatory Visit: Payer: Self-pay | Admitting: Internal Medicine

## 2019-05-12 ENCOUNTER — Encounter: Payer: Self-pay | Admitting: Internal Medicine

## 2019-05-12 ENCOUNTER — Ambulatory Visit (INDEPENDENT_AMBULATORY_CARE_PROVIDER_SITE_OTHER): Payer: Self-pay | Admitting: Internal Medicine

## 2019-05-12 ENCOUNTER — Other Ambulatory Visit: Payer: Self-pay

## 2019-05-12 VITALS — BP 116/77 | HR 77 | Temp 98.0°F | Wt 219.0 lb

## 2019-05-12 DIAGNOSIS — Z113 Encounter for screening for infections with a predominantly sexual mode of transmission: Secondary | ICD-10-CM

## 2019-05-12 DIAGNOSIS — Z23 Encounter for immunization: Secondary | ICD-10-CM

## 2019-05-12 DIAGNOSIS — B20 Human immunodeficiency virus [HIV] disease: Secondary | ICD-10-CM

## 2019-05-12 DIAGNOSIS — Z79899 Other long term (current) drug therapy: Secondary | ICD-10-CM

## 2019-05-12 NOTE — Progress Notes (Signed)
   Subjective:    Patient ID: Eduardo Savage, male    DOB: 1980/11/10, 39 y.o.   MRN: [id number]  HPI Here for follow up of HIV He has not been seen since June 2019 and now has been off of his medications for over 1 month.  CD4 374 and viral load 159,000.  No new issues otherwise including no weight loss, no diarrhea.     Review of Systems  Constitutional: Negative for fatigue.  Gastrointestinal: Negative for diarrhea and nausea.  Skin: Negative for rash.       Objective:   Physical Exam Constitutional:      Appearance: Normal appearance.  Eyes:     General: No scleral icterus. Cardiovascular:     Rate and Rhythm: Normal rate and regular rhythm.     Heart sounds: No murmur.  Pulmonary:     Effort: Pulmonary effort is normal. No respiratory distress.  Skin:    Findings: No rash.  Neurological:     Mental Status: He is alert.  Psychiatric:        Mood and Affect: Mood normal.   SH: + tobacco        Assessment & Plan:

## 2019-05-12 NOTE — Assessment & Plan Note (Signed)
Discussed flu shot and given today 

## 2019-05-12 NOTE — Assessment & Plan Note (Signed)
Symptomatic/uncontrolled after letting his HMAP lapse.  He is renewing today and has a bottle of genvoya to start back with.  He will start today and return for labs in 4 weeks and then folllow up with me.  I emphasized complaince.

## 2019-05-13 ENCOUNTER — Encounter: Payer: Self-pay | Admitting: Internal Medicine

## 2019-06-03 ENCOUNTER — Other Ambulatory Visit: Payer: Self-pay | Admitting: Internal Medicine

## 2019-06-03 DIAGNOSIS — B20 Human immunodeficiency virus [HIV] disease: Secondary | ICD-10-CM

## 2019-06-09 ENCOUNTER — Other Ambulatory Visit: Payer: Self-pay

## 2019-06-09 DIAGNOSIS — Z113 Encounter for screening for infections with a predominantly sexual mode of transmission: Secondary | ICD-10-CM

## 2019-06-09 DIAGNOSIS — Z79899 Other long term (current) drug therapy: Secondary | ICD-10-CM

## 2019-06-09 DIAGNOSIS — B20 Human immunodeficiency virus [HIV] disease: Secondary | ICD-10-CM

## 2019-06-10 LAB — COMPLETE METABOLIC PANEL WITH GFR
AG Ratio: 1.6 (calc) (ref 1.0–2.5)
ALT: 19 U/L (ref 9–46)
AST: 13 U/L (ref 10–40)
Albumin: 4.2 g/dL (ref 3.6–5.1)
Alkaline phosphatase (APISO): 115 U/L (ref 36–130)
BUN: 14 mg/dL (ref 7–25)
CO2: 25 mmol/L (ref 20–32)
Calcium: 8.6 mg/dL (ref 8.6–10.3)
Chloride: 104 mmol/L (ref 98–110)
Creat: 0.81 mg/dL (ref 0.60–1.35)
GFR, Est African American: 132 mL/min/{1.73_m2} (ref >=60)
GFR, Est Non African American: 114 mL/min/{1.73_m2} (ref >=60)
Globulin: 2.6 g/dL (calc) (ref 1.9–3.7)
Glucose, Bld: 175 mg/dL — ABNORMAL HIGH (ref 65–99)
Potassium: 3.8 mmol/L (ref 3.5–5.3)
Sodium: 139 mmol/L (ref 135–146)
Total Bilirubin: 0.4 mg/dL (ref 0.2–1.2)
Total Protein: 6.8 g/dL (ref 6.1–8.1)

## 2019-06-10 LAB — LIPID PANEL
Cholesterol: 177 mg/dL (ref <200)
HDL: 37 mg/dL — ABNORMAL LOW (ref >=40)
LDL Cholesterol (Calc): 107 mg/dL (calc) — ABNORMAL HIGH
Non-HDL Cholesterol (Calc): 140 mg/dL (calc) — ABNORMAL HIGH (ref <130)
Total CHOL/HDL Ratio: 4.8 (calc) (ref <5.0)
Triglycerides: 221 mg/dL — ABNORMAL HIGH (ref <150)

## 2019-06-10 LAB — CBC WITH DIFFERENTIAL/PLATELET
Absolute Monocytes: 696 cells/uL (ref 200–950)
Basophils Absolute: 75 cells/uL (ref 0–200)
Basophils Relative: 0.7 %
Eosinophils Absolute: 396 cells/uL (ref 15–500)
Eosinophils Relative: 3.7 %
HCT: 44.2 % (ref 38.5–50.0)
Hemoglobin: 14.6 g/dL (ref 13.2–17.1)
Lymphs Abs: 2247 cells/uL (ref 850–3900)
MCH: 28.4 pg (ref 27.0–33.0)
MCHC: 33 g/dL (ref 32.0–36.0)
MCV: 86 fL (ref 80.0–100.0)
MPV: 9.9 fL (ref 7.5–12.5)
Monocytes Relative: 6.5 %
Neutro Abs: 7287 cells/uL (ref 1500–7800)
Neutrophils Relative %: 68.1 %
Platelets: 235 10*3/uL (ref 140–400)
RBC: 5.14 10*6/uL (ref 4.20–5.80)
RDW: 12.2 % (ref 11.0–15.0)
Total Lymphocyte: 21 %
WBC: 10.7 10*3/uL (ref 3.8–10.8)

## 2019-06-10 LAB — HIV-1 RNA QUANT-NO REFLEX-BLD
HIV 1 RNA Quant: 134 copies/mL — ABNORMAL HIGH
HIV-1 RNA Quant, Log: 2.13 Log copies/mL — ABNORMAL HIGH

## 2019-06-10 LAB — RPR: RPR Ser Ql: NONREACTIVE

## 2019-06-10 LAB — T-HELPER CELL (CD4) - (RCID CLINIC ONLY)
CD4 % Helper T Cell: 25 % — ABNORMAL LOW (ref 33–65)
CD4 T Cell Abs: 530 /uL (ref 400–1790)

## 2019-06-23 ENCOUNTER — Ambulatory Visit (INDEPENDENT_AMBULATORY_CARE_PROVIDER_SITE_OTHER): Payer: Self-pay | Admitting: Internal Medicine

## 2019-06-23 ENCOUNTER — Encounter: Payer: Self-pay | Admitting: Internal Medicine

## 2019-06-23 DIAGNOSIS — R739 Hyperglycemia, unspecified: Secondary | ICD-10-CM | POA: Insufficient documentation

## 2019-06-23 DIAGNOSIS — Z113 Encounter for screening for infections with a predominantly sexual mode of transmission: Secondary | ICD-10-CM

## 2019-06-23 DIAGNOSIS — B20 Human immunodeficiency virus [HIV] disease: Secondary | ICD-10-CM

## 2019-06-23 NOTE — Assessment & Plan Note (Signed)
Screened negative 

## 2019-06-23 NOTE — Assessment & Plan Note (Addendum)
Blood sugar noted and will get him into primary care Discussed with the patient

## 2019-06-23 NOTE — Assessment & Plan Note (Addendum)
He is doing well and I will have him return in 3 months for follow up  15 minutes spent

## 2019-06-23 NOTE — Progress Notes (Signed)
   Subjective:    Patient ID: Eduardo Savage, male    DOB: [birth year], 39 y.o.   MRN: [id number]  HPI I connected with  Eduardo Savage on 06/23/19 by phone and verified that I am speaking with the correct person using two identifiers.   I discussed the limitations of evaluation and management by telemedicine. The patient expressed understanding and agreed to proceed.  He is doing well on Genvoya and no missed doses.  Feels well.  No new issues.   Labs reviewed with him and CD4 good, up to 530 and viral load down to 134.  No complaints.  Glucose up to 175 and discussed with him.   Review of Systems  Gastrointestinal: Negative for diarrhea and nausea.  Skin: Negative for rash.       Objective:   Physical Exam        Assessment & Plan:

## 2019-06-29 ENCOUNTER — Telehealth: Payer: Self-pay | Admitting: *Deleted

## 2019-06-29 NOTE — Telephone Encounter (Signed)
RN called patient, left message asking him to call back so I can help set up his mychart and make appointments for follow up.  Will give him information for primary care options as well for hyperglycemia (Internal medicine [phone], [facility] and wellness [phone], Patient care center [phone]). Andree Coss, RN

## 2019-06-29 NOTE — Telephone Encounter (Signed)
-----   Message from Gardiner Barefoot, MD sent at 06/23/2019 11:36 AM EST ----- Can we get him a follow up in 3 months with labs prior to the visit Help him get on Mychart Get him a PCP for hyperglycemia  thanks

## 2019-06-29 NOTE — Telephone Encounter (Signed)
Was able to reach patient and provide all information. Appointments also scheduled.  Eduardo Savage

## 2019-06-30 NOTE — Telephone Encounter (Signed)
Thanks

## 2019-07-07 ENCOUNTER — Other Ambulatory Visit: Payer: Self-pay | Admitting: Family

## 2019-07-07 DIAGNOSIS — B20 Human immunodeficiency virus [HIV] disease: Secondary | ICD-10-CM

## 2019-08-04 ENCOUNTER — Other Ambulatory Visit: Payer: Self-pay | Admitting: Internal Medicine

## 2019-08-04 DIAGNOSIS — B20 Human immunodeficiency virus [HIV] disease: Secondary | ICD-10-CM

## 2019-08-24 IMAGING — CR DG CHEST 2V
2 series · 2 of 2 positions shown · non-contrast
Comparison: None.

CLINICAL DATA: Chest pain and cough

EXAM:
CHEST - 2 VIEW

[chest pa]
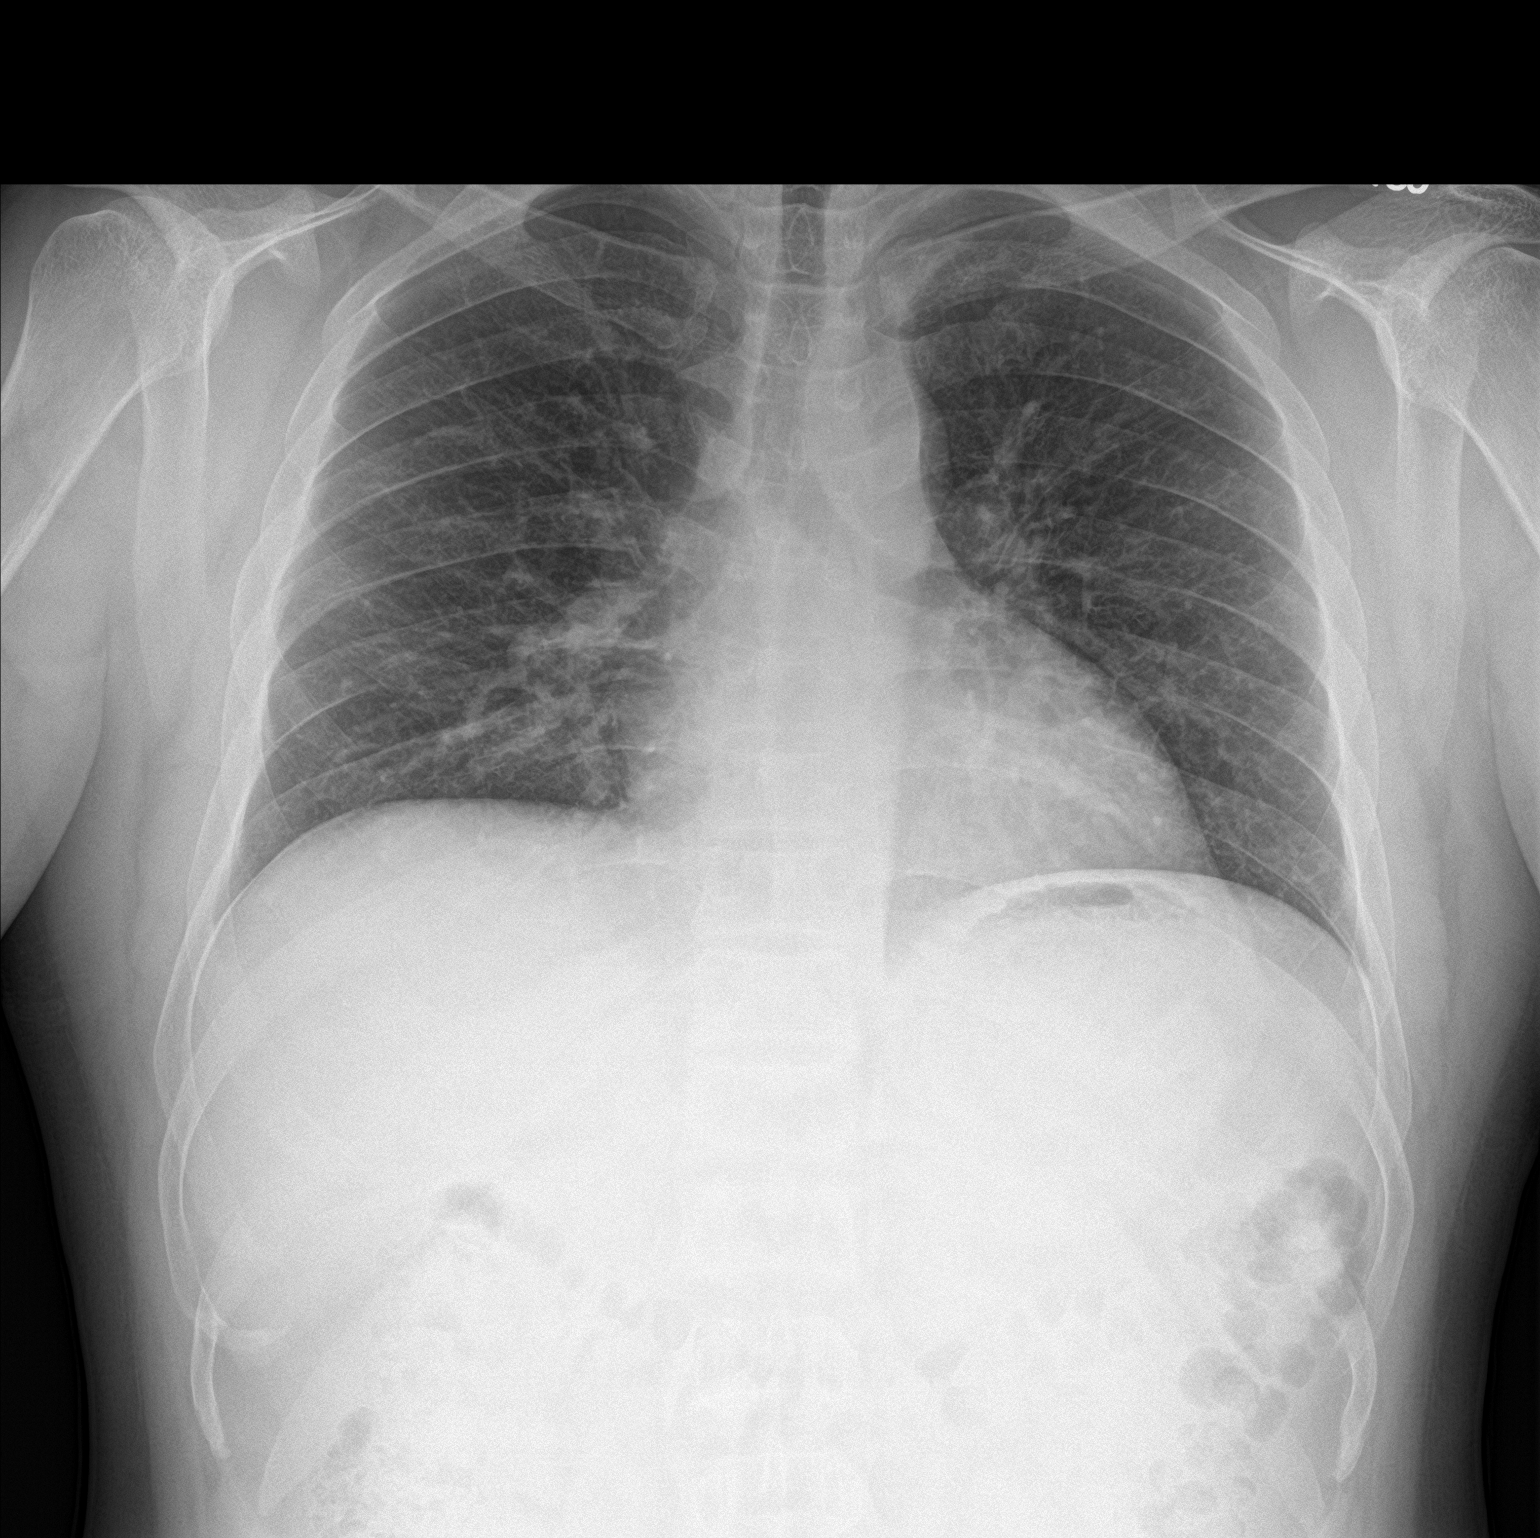

[chest lat]
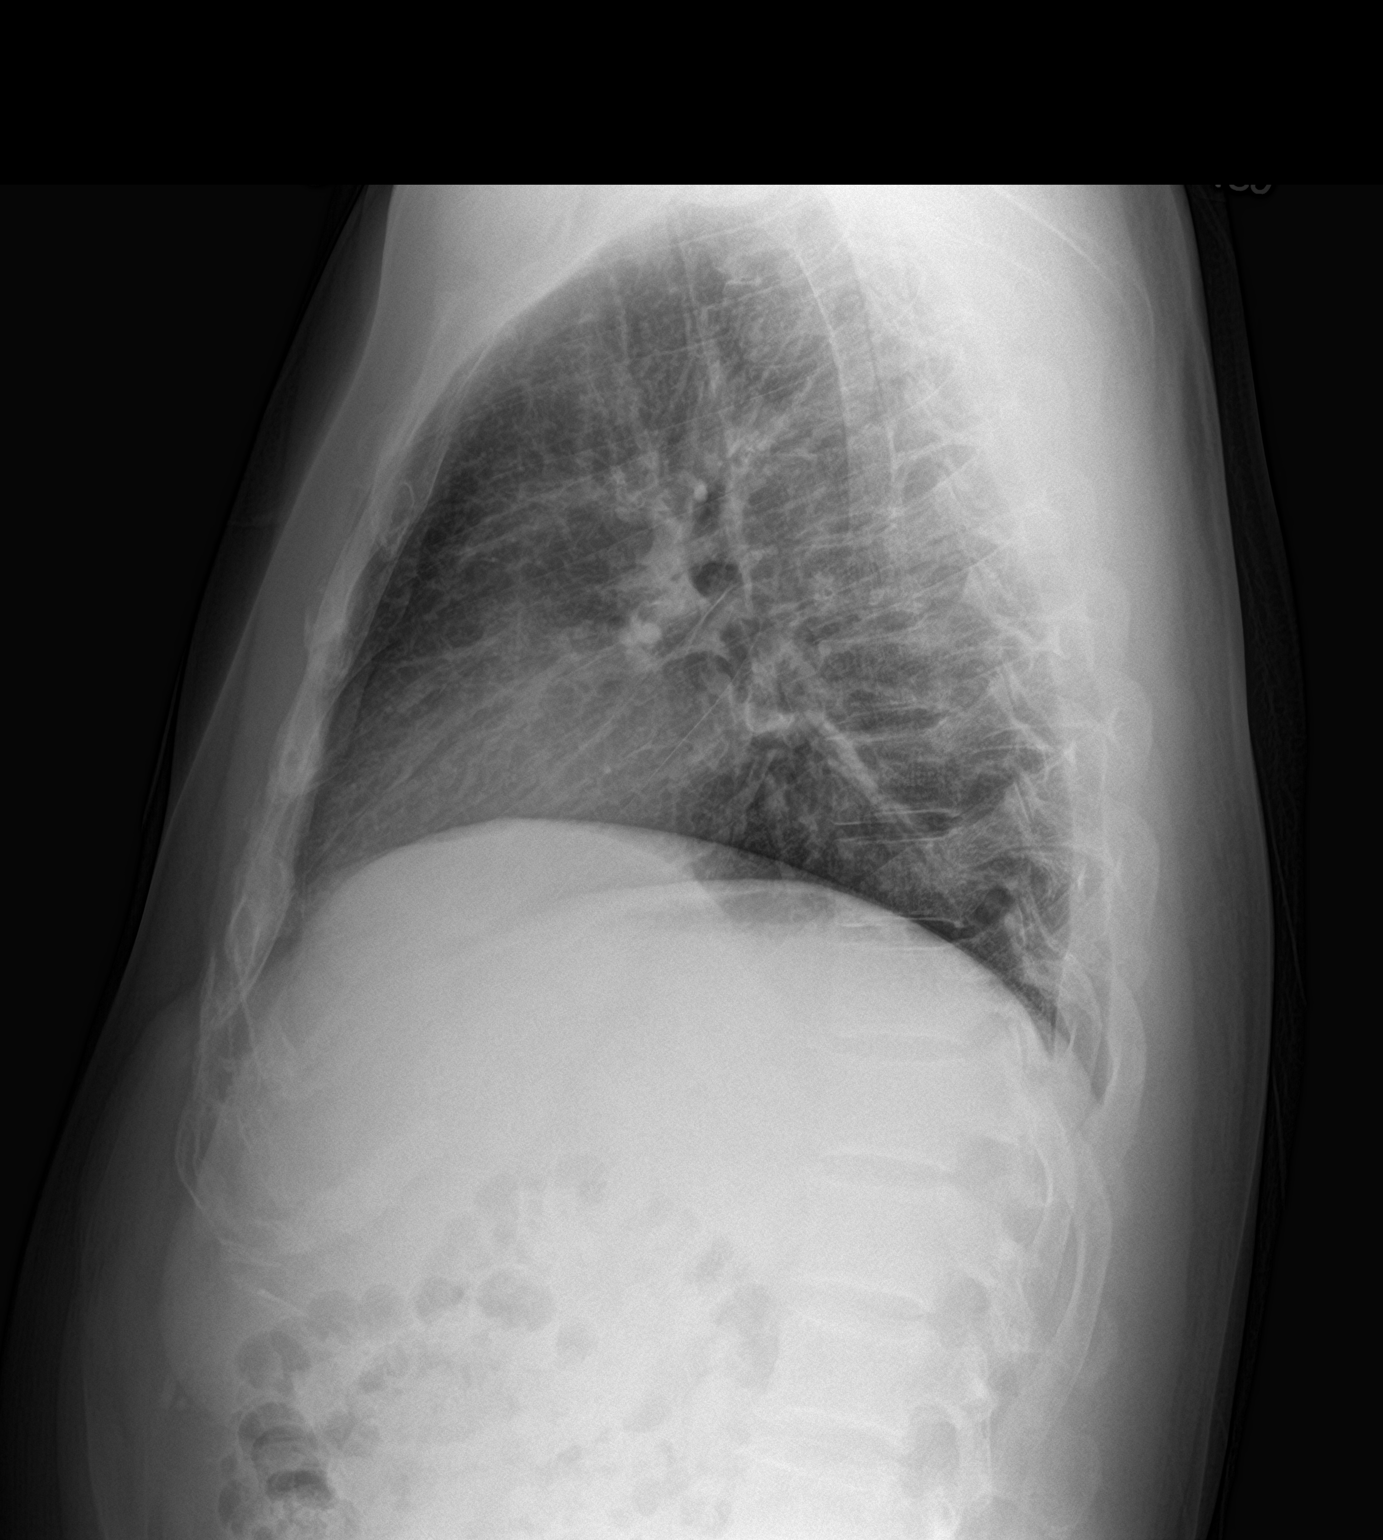

[2 of 2 positions shown; findings below may reference images not displayed]

FINDINGS: There is shallow lung inflation. The cardiomediastinal contours are
normal.

Mild peribronchial opacity without consolidation or edema. There is
no pleural effusion or pneumothorax.
IMPRESSION: Mild peribronchial coarsening may indicate chronic bronchitis. No
focal airspace consolidation.

## 2019-08-26 ENCOUNTER — Other Ambulatory Visit: Payer: Self-pay

## 2019-09-07 ENCOUNTER — Ambulatory Visit: Payer: Self-pay | Admitting: Internal Medicine

## 2021-05-30 ENCOUNTER — Telehealth: Payer: Self-pay

## 2021-05-30 NOTE — Telephone Encounter (Signed)
Patient last seen 06/2019, called to offer appointment. No answer and unable to leave message.   Sandie Ano, RN

## 2021-08-15 ENCOUNTER — Telehealth: Payer: Self-pay

## 2021-08-15 NOTE — Telephone Encounter (Signed)
Called patient to offer overdue appointment, no answer and no voicemail set up. ? ?Referred to state bridge counselor 05/2021. ? ?Beryle Flock, RN ? ?
